# Patient Record
Sex: Male | Born: 1955 | Race: White | Hispanic: No | Marital: Married | Smoking: Never smoker
Health system: Southern US, Community
[De-identification: ages and names within clinical notes are randomized; demographics above are authoritative.]

## PROBLEM LIST (undated history)

## (undated) DIAGNOSIS — E785 Hyperlipidemia, unspecified: Secondary | ICD-10-CM

## (undated) DIAGNOSIS — E119 Type 2 diabetes mellitus without complications: Secondary | ICD-10-CM

## (undated) DIAGNOSIS — I2510 Atherosclerotic heart disease of native coronary artery without angina pectoris: Secondary | ICD-10-CM

## (undated) DIAGNOSIS — I10 Essential (primary) hypertension: Secondary | ICD-10-CM

## (undated) DIAGNOSIS — I350 Nonrheumatic aortic (valve) stenosis: Secondary | ICD-10-CM

## (undated) HISTORY — DX: Nonrheumatic aortic (valve) stenosis: I35.0

## (undated) HISTORY — DX: Essential (primary) hypertension: I10

## (undated) HISTORY — DX: Hyperlipidemia, unspecified: E78.5

## (undated) HISTORY — DX: Atherosclerotic heart disease of native coronary artery without angina pectoris: I25.10

## (undated) HISTORY — PX: EYE SURGERY: SHX253

---

## 2001-08-26 ENCOUNTER — Encounter: Payer: Self-pay | Admitting: Ophthalmology

## 2001-08-26 ENCOUNTER — Ambulatory Visit (HOSPITAL_COMMUNITY): Admission: RE | Admit: 2001-08-26 | Discharge: 2001-08-27 | Payer: Self-pay | Admitting: Ophthalmology

## 2009-09-02 ENCOUNTER — Emergency Department (HOSPITAL_COMMUNITY): Admission: EM | Admit: 2009-09-02 | Discharge: 2009-09-02 | Payer: Self-pay | Admitting: Emergency Medicine

## 2010-09-24 LAB — CBC
HCT: 47.5 % (ref 39.0–52.0)
Hemoglobin: 16.8 g/dL (ref 13.0–17.0)
MCHC: 35.2 g/dL (ref 30.0–36.0)
MCV: 96.9 fL (ref 78.0–100.0)
Platelets: 208 10*3/uL (ref 150–400)
RBC: 4.91 MIL/uL (ref 4.22–5.81)
RDW: 13.3 % (ref 11.5–15.5)
WBC: 6.4 10*3/uL (ref 4.0–10.5)

## 2010-09-24 LAB — POCT CARDIAC MARKERS: Myoglobin, poc: 69.8 ng/mL (ref 12–200)

## 2010-09-24 LAB — POCT I-STAT, CHEM 8
BUN: 13 mg/dL (ref 6–23)
Calcium, Ion: 1.11 mmol/L — ABNORMAL LOW (ref 1.12–1.32)
Chloride: 103 mEq/L (ref 96–112)
Creatinine, Ser: 0.9 mg/dL (ref 0.4–1.5)
Glucose, Bld: 139 mg/dL — ABNORMAL HIGH (ref 70–99)
HCT: 48 % (ref 39.0–52.0)
Hemoglobin: 16.3 g/dL (ref 13.0–17.0)
Potassium: 4.1 mEq/L (ref 3.5–5.1)
Sodium: 138 mEq/L (ref 135–145)
TCO2: 29 mmol/L (ref 0–100)

## 2010-09-24 LAB — DIFFERENTIAL
Basophils Absolute: 0 10*3/uL (ref 0.0–0.1)
Basophils Relative: 0 % (ref 0–1)
Eosinophils Absolute: 0 10*3/uL (ref 0.0–0.7)
Eosinophils Relative: 1 % (ref 0–5)
Lymphocytes Relative: 14 % (ref 12–46)
Lymphs Abs: 0.9 10*3/uL (ref 0.7–4.0)
Monocytes Absolute: 0.6 10*3/uL (ref 0.1–1.0)
Monocytes Relative: 9 % (ref 3–12)
Neutro Abs: 4.9 10*3/uL (ref 1.7–7.7)
Neutrophils Relative %: 76 % (ref 43–77)

## 2010-09-24 LAB — PROTIME-INR
INR: 1.1 (ref 0.00–1.49)
Prothrombin Time: 14.1 seconds (ref 11.6–15.2)

## 2010-11-17 NOTE — Op Note (Signed)
[place]. [facility]  Patient:    Samuel Wallace, Samuel Wallace Visit Number: [id number] MRN: [id number]          Service Type: DSU Location: [id number] Attending Physician:  Bertrum Sol Dictated by:   Beulah Gandy. Ashley Royalty, M.D. Proc. Date: 08/26/01 Admit Date:  08/26/2001                             Operative Report  DATE OF BIRTH:  [birth year].  PREOPERATIVE DIAGNOSIS:  Retinal break and vitreous hemorrhage in the left eye.  POSTOPERATIVE DIAGNOSIS:  Retinal break and vitreous hemorrhage in the left eye.  OPERATION PERFORMED:  Pars plana vitrectomy with retinal photocoagulation for retinal break in the left eye.  SURGEON:  Beulah Gandy. Ashley Royalty, M.D.  ASSISTANT:  Ulysees Barns, LPN  ANESTHESIA:  General.  DESCRIPTION OF PROCEDURE:  Usual prep and drape.  Peritomies at 10, 2 and 4 oclock, a 4 mm angled infusion port anchored into place at 4 oclock.  A lighted pick and the cutter were placed at 10 and 2 oclock respectively.  The pars plana vitrectomy was begun just the crystalline lens.  Blood and vitreous were mixed.  These were carefully removed with low suctioning and rapid cutting down to the macular surface.  The vitrectomy was carried out into the far periphery with a 30 degree prismatic lens.  Scleral depression was used to gain access to the far peripheral vitreous space.  The vitreous base was trimmed with the vitrectomy cutter.  Low suction was used the entire time. Once all the blood was removed from the eye, the [employer] [facility] brush was used to vacuum blood from the posterior segment of the eye on the retinal surface.  Once this was accomplished, the instruments were removed from the eye and 9-0 nylon was used to close the sclerotomy sites.  The indirect ophthalmoscope laser was moved into place and 930 burns were placed in two rows around the retinal periphery.  A break was seen in the upper temporal quadrant.  This was carefully  surrounded with laser.  The size was 1000 microns each, 0.1 second each and the power was between 400 and 450 mw.  Once this was accomplished the conjunctiva was closed with wet field cautery. Polymyxin and gentamicin were irrigated into [place] space and atropine solution was applied.  Decadron 10 mg was injected into the lower subconjunctival space.  The closing tension was less than 10 with a Barraquer tonometer.  Polysporin, a patch and shield were placed.  The patient was awakened and taken to recover in satisfactory condition.  COMPLICATIONS:  None.  DURATION:  One hour. Dictated by:   Beulah Gandy. Ashley Royalty, M.D. Attending Physician:  Bertrum Sol DD:  08/26/01 TD:  08/26/01 Job: [id number]

## 2012-09-27 ENCOUNTER — Inpatient Hospital Stay (HOSPITAL_COMMUNITY)
Admission: EM | Admit: 2012-09-27 | Discharge: 2012-09-30 | DRG: 247 | Disposition: A | Discharge status: Home / Self Care | Payer: 59 | Attending: Internal Medicine | Admitting: Internal Medicine

## 2012-09-27 ENCOUNTER — Encounter (HOSPITAL_COMMUNITY): Payer: Self-pay | Admitting: *Deleted

## 2012-09-27 ENCOUNTER — Emergency Department (HOSPITAL_COMMUNITY): Payer: 59

## 2012-09-27 DIAGNOSIS — E119 Type 2 diabetes mellitus without complications: Secondary | ICD-10-CM | POA: Diagnosis present

## 2012-09-27 DIAGNOSIS — Z955 Presence of coronary angioplasty implant and graft: Secondary | ICD-10-CM

## 2012-09-27 DIAGNOSIS — I214 Non-ST elevation (NSTEMI) myocardial infarction: Secondary | ICD-10-CM

## 2012-09-27 DIAGNOSIS — I2510 Atherosclerotic heart disease of native coronary artery without angina pectoris: Secondary | ICD-10-CM | POA: Diagnosis present

## 2012-09-27 DIAGNOSIS — I2584 Coronary atherosclerosis due to calcified coronary lesion: Secondary | ICD-10-CM | POA: Diagnosis present

## 2012-09-27 DIAGNOSIS — I10 Essential (primary) hypertension: Secondary | ICD-10-CM | POA: Diagnosis present

## 2012-09-27 DIAGNOSIS — R079 Chest pain, unspecified: Secondary | ICD-10-CM | POA: Diagnosis present

## 2012-09-27 HISTORY — DX: Type 2 diabetes mellitus without complications: E11.9

## 2012-09-27 LAB — CBC
Hemoglobin: 15.8 g/dL (ref 13.0–17.0)
MCH: 32.4 pg (ref 26.0–34.0)
MCHC: 36.1 g/dL — ABNORMAL HIGH (ref 30.0–36.0)
MCV: 90.6 fL (ref 78.0–100.0)
Platelets: 205 10*3/uL (ref 150–400)
Platelets: 234 10*3/uL (ref 150–400)
RBC: 4.88 MIL/uL (ref 4.22–5.81)
RDW: 13 % (ref 11.5–15.5)
WBC: 11.8 10*3/uL — ABNORMAL HIGH (ref 4.0–10.5)
WBC: 8.5 10*3/uL (ref 4.0–10.5)

## 2012-09-27 LAB — BASIC METABOLIC PANEL
CO2: 25 mEq/L (ref 19–32)
Calcium: 9.7 mg/dL (ref 8.4–10.5)
Chloride: 98 mEq/L (ref 96–112)
Creatinine, Ser: 0.99 mg/dL (ref 0.50–1.35)
GFR calc Af Amer: >90 mL/min (ref >90)
GFR calc non Af Amer: >90 mL/min (ref >90)
Glucose, Bld: 236 mg/dL — ABNORMAL HIGH (ref 70–99)
Potassium: 4 mEq/L (ref 3.5–5.1)
Sodium: 138 mEq/L (ref 135–145)

## 2012-09-27 LAB — POCT I-STAT TROPONIN I: Troponin i, poc: 0.16 ng/mL (ref 0.00–0.08)

## 2012-09-27 LAB — PROTIME-INR
INR: 0.96 (ref 0.00–1.49)
INR: 1.01 (ref 0.00–1.49)

## 2012-09-27 LAB — TROPONIN I: Troponin I: 3.89 ng/mL (ref <0.30)

## 2012-09-27 LAB — GLUCOSE, CAPILLARY: Glucose-Capillary: 111 mg/dL — ABNORMAL HIGH (ref 70–99)

## 2012-09-27 MED ORDER — MORPHINE SULFATE 2 MG/ML IJ SOLN: 2.0000 mg | INTRAMUSCULAR | Status: DC | PRN

## 2012-09-27 MED ORDER — OXYCODONE HCL 5 MG PO TABS: 5.0000 mg | ORAL_TABLET | ORAL | Status: DC | PRN

## 2012-09-27 MED ORDER — ONDANSETRON HCL 4 MG/2ML IJ SOLN: 4.0000 mg | Freq: Four times a day (QID) | INTRAMUSCULAR | Status: DC | PRN

## 2012-09-27 MED ORDER — DIAZEPAM 2 MG PO TABS
2.0000 mg | ORAL_TABLET | ORAL | Status: AC
  Administered 2012-09-29: 2 mg via ORAL

## 2012-09-27 MED ORDER — INSULIN ASPART 100 UNIT/ML ~~LOC~~ SOLN
0.0000 [IU] | Freq: Three times a day (TID) | SUBCUTANEOUS | Status: DC
  Administered 2012-09-28: 1 [IU] via SUBCUTANEOUS
  Administered 2012-09-28: 2 [IU] via SUBCUTANEOUS

## 2012-09-27 MED ORDER — ACETAMINOPHEN 325 MG PO TABS: 650.0000 mg | ORAL_TABLET | Freq: Four times a day (QID) | ORAL | Status: DC | PRN

## 2012-09-27 MED ORDER — SODIUM CHLORIDE 0.9 % IV SOLN: 250.0000 mL | INTRAVENOUS | Status: DC | PRN

## 2012-09-27 MED ORDER — SODIUM CHLORIDE 0.9 % IJ SOLN: 3.0000 mL | INTRAMUSCULAR | Status: DC | PRN

## 2012-09-27 MED ORDER — ASPIRIN 81 MG PO CHEW
324.0000 mg | CHEWABLE_TABLET | ORAL | Status: AC
  Administered 2012-09-29: 324 mg via ORAL
  Filled 2012-09-27: qty 4

## 2012-09-27 MED ORDER — SODIUM CHLORIDE 0.9 % IV SOLN
INTRAVENOUS | Status: DC
  Administered 2012-09-27: 10 mL via INTRAVENOUS

## 2012-09-27 MED ORDER — ASPIRIN EC 325 MG PO TBEC: 325.0000 mg | DELAYED_RELEASE_TABLET | Freq: Every day | ORAL | Status: DC

## 2012-09-27 MED ORDER — ASPIRIN EC 325 MG PO TBEC
325.0000 mg | DELAYED_RELEASE_TABLET | Freq: Every day | ORAL | Status: DC
  Administered 2012-09-28: 325 mg via ORAL
  Filled 2012-09-27 (×2): qty 1

## 2012-09-27 MED ORDER — SODIUM CHLORIDE 0.9 % IV SOLN: INTRAVENOUS | Status: DC

## 2012-09-27 MED ORDER — SODIUM CHLORIDE 0.9 % IJ SOLN
3.0000 mL | Freq: Two times a day (BID) | INTRAMUSCULAR | Status: DC
  Administered 2012-09-28: 3 mL via INTRAVENOUS

## 2012-09-27 MED ORDER — LISINOPRIL 10 MG PO TABS: 5.0000 mg | ORAL_TABLET | Freq: Every day | ORAL | Status: DC

## 2012-09-27 MED ORDER — SODIUM CHLORIDE 0.9 % IJ SOLN
3.0000 mL | Freq: Two times a day (BID) | INTRAMUSCULAR | Status: DC
  Administered 2012-09-27 – 2012-09-28 (×2): 3 mL via INTRAVENOUS

## 2012-09-27 MED ORDER — METOPROLOL TARTRATE 25 MG PO TABS
25.0000 mg | ORAL_TABLET | Freq: Two times a day (BID) | ORAL | Status: DC
  Administered 2012-09-27 – 2012-09-30 (×5): 25 mg via ORAL
  Filled 2012-09-27 (×8): qty 1

## 2012-09-27 MED ORDER — HEPARIN (PORCINE) IN NACL 100-0.45 UNIT/ML-% IJ SOLN
1700.0000 [IU]/h | INTRAMUSCULAR | Status: DC
  Administered 2012-09-27 (×2): 1200 [IU]/h via INTRAVENOUS
  Administered 2012-09-28 (×2): 1300 [IU]/h via INTRAVENOUS
  Administered 2012-09-29: 1700 [IU]/h via INTRAVENOUS
  Filled 2012-09-27 (×4): qty 250

## 2012-09-27 MED ORDER — NITROGLYCERIN IN D5W 200-5 MCG/ML-% IV SOLN
5.0000 ug/min | INTRAVENOUS | Status: DC
  Administered 2012-09-27: 5 ug/min via INTRAVENOUS
  Filled 2012-09-27: qty 250

## 2012-09-27 MED ORDER — HEPARIN BOLUS VIA INFUSION
4000.0000 [IU] | Freq: Once | INTRAVENOUS | Status: AC
  Administered 2012-09-27: 4000 [IU] via INTRAVENOUS

## 2012-09-27 MED ORDER — ACETAMINOPHEN 650 MG RE SUPP: 650.0000 mg | Freq: Four times a day (QID) | RECTAL | Status: DC | PRN

## 2012-09-27 MED ORDER — ONDANSETRON HCL 4 MG PO TABS: 4.0000 mg | ORAL_TABLET | Freq: Four times a day (QID) | ORAL | Status: DC | PRN

## 2012-09-27 NOTE — ED Notes (Signed)
Dr.Allen made aware of Istat Trop. Level. ED-Lab

## 2012-09-27 NOTE — Consult Note (Addendum)
Admit date: 09/27/2012 Referring Physician  Dr. Brien Few Primary Physician  Dr. Duanne Guess Primary Cardiologist  NONE Reason for Consultation  Chest pain  HPI: This is a 57 year old male, with known history of DM, Retinal detachment right eye in 1999, repaired at [facility], Retinal break and vitreous hemorrhage in the left eye, s/p vitrectomy/photocoagulation 08/2001, otherwise, no other significant medical problems, presenting with chest pain. Patient is a good historian, ably assisted by his spouse who was in the ED, and according to him, at about 11:00 AM today, while driving, he developed some retrosternal discomfort, which was initially mild, and felt like pressure like "indigestion".   He felt that he needed some food, so stopped and had a meal, with some amelioration of his chest discomfort, followed by worsening.He drove home, with the discomfort increasing steadily, and by the time he arrived home at 12:30 PM, pain was radiating into both jaws, he was sweaty and clammy. His spouse gave him 4 ASA, and brought him to the ED. By 2:00 PM, he was completely pain-free. Initial POC markers, showed troponin of 0.16. Cardiology is now asked to evaluate.  Currently he is pain free. He denies any chest pain.      PMH:   Past Medical History  Diagnosis Date  . Diabetes mellitus without complication      PSH:  vitrectomy/photocoagulation 08/2001  Allergies:  Review of patient's allergies indicates no known allergies. Prior to Admit Meds:   (Not in a hospital admission) Fam HX:   History reviewed. No pertinent family history. Social HX:    History   Social History  . Marital Status: Married    Spouse Name: N/A    Number of Children: N/A  . Years of Education: N/A   Occupational History  . Not on file.   Social History Main Topics  . Smoking status: Never smoked  . Smokeless tobacco: No  . Alcohol Use: No  . Drug Use: No  . Sexually Active: Not on file   Other Topics Concern  . Not on file    Social History Narrative  . No narrative on file     ROS:  All 11 ROS were addressed and are negative except what is stated in the HPI  Physical Exam: Blood pressure 148/92, pulse 89, temperature 98.3 F (36.8 C), temperature source Oral, resp. rate 16, SpO2 97.00%.    General: Well developed, well nourished, in no acute distress Head: Eyes PERRLA, No xanthomas.   Normal cephalic and atramatic  Lungs:   Clear bilaterally to auscultation and percussion. Heart:   HRRR S1 S2 Pulses are 2+ & equal.            No carotid bruit. No JVD.  No abdominal bruits. No femoral bruits. Abdomen: Bowel sounds are positive, abdomen soft and non-tender without masses Extremities:   No clubbing, cyanosis or edema.  DP +1 Neuro: Alert and oriented X 3. Psych:  Good affect, responds appropriately    Labs:   Lab Results  Component Value Date   WBC 11.8* 09/27/2012   HGB 15.8 09/27/2012   HCT 44.2 09/27/2012   MCV 90.6 09/27/2012   PLT 205 09/27/2012    Recent Labs Lab 09/27/12 1445  NA 136  K 4.3  CL 98  CO2 25  BUN 18  CREATININE 0.99  CALCIUM 10.2  GLUCOSE 236*   No results found for this basename: PTT   Lab Results  Component Value Date   INR 1.10 09/02/2009  Radiology:  Dg Chest 2 View  09/27/2012  *RADIOLOGY REPORT*  Clinical Data: Chest pressure, jaw pain  CHEST - 2 VIEW  Comparison: 09/02/2009  Findings: Normal heart size and vascularity.  Mild hyperinflation. No focal pneumonia, collapse, consolidation, effusion or pneumothorax.  Chronic apical scarring.  Stable exam.  IMPRESSION: Stable chest exam.  No superimposed acute process   Original Report Authenticated By: Judie Petit. Shick, M.D.     EKG:  NSR with septal infarct and nonspecific T wave changes  ASSESSMENT:  1.  Acute coronary syndrome with intial POC troponin elevated c/w NSTEMI - currently pain free 2.  DM 3.  HTN  PLAN:   1.  Continue to cycle cardiac enzymes 2.  Continue ACE I/ASA/IV Heparin gtt 3.  Hold  Metformin 4.  Start Lopressor 25mg  BID 5.  NPO after midnight 3/30 for cath in am 3/31   Quintella Reichert, MD  09/27/2012  6:10 PM

## 2012-09-27 NOTE — Progress Notes (Signed)
ANTICOAGULATION CONSULT NOTE - Initial Consult  Pharmacy Consult for Heparin Indication: chest pain/ACS  No Known Allergies  Patient Measurements:   Heparin Dosing Weight:    Vital Signs: Temp: 98.3 F (36.8 C) (03/29 1440) Temp src: Oral (03/29 1440) BP: 146/95 mmHg (03/29 1800) Pulse Rate: 92 (03/29 1800)  Labs:  Recent Labs  09/27/12 1445  HGB 15.8  HCT 44.2  PLT 205  CREATININE 0.99    CrCl is unknown because there is no height on file for the current visit.   Medical History: Past Medical History  Diagnosis Date  . Diabetes mellitus without complication     Medications: ASA 81mg , Glipizide, metformin  Assessment: Chest pressure + jaw pain PMH only significant for DM. Initial troponin 0.16. BMET and CBC WNL.  Goal of Therapy:  Heparin level 0.3-0.7 units/ml Monitor platelets by anticoagulation protocol: Yes   Plan:  Cath 3/31 Heparin 4000 unit IV bolus, then infusiion 1200 units/hr Check heparin level in 6-8 hrs and daily.   Enisa Runyan S. Merilynn Finland, PharmD, BCPS Clinical Staff Pharmacist Pager [phone]  Misty Stanley Stillinger 09/27/2012,6:29 PM

## 2012-09-27 NOTE — Progress Notes (Signed)
Entered in error

## 2012-09-27 NOTE — Progress Notes (Signed)
Comment:   Has bump in Troponin to 3.89. Have discussed with Dr Mayford Knife. Cath is already scheduled for 09/29/12, and this will remain same. But patient to be transferred to SDU, and commenced on NTG ivi, as well as continued beta-blocker. Will DC Lisinopril.   C. Cailah Reach. MD, FACP.

## 2012-09-27 NOTE — ED Notes (Signed)
Pt reports onset at 11am of mid chest pressure that felt like gas bubble, but also had discomfort in his jaw. Pain has gradually improved pta. No acute distress noted at triage.

## 2012-09-27 NOTE — ED Provider Notes (Addendum)
History     CSN: [id number]  Arrival date & time 09/27/12  1413   First MD Initiated Contact with Patient 09/27/12 1511      Chief Complaint  Patient presents with  . Chest Pain    (Consider location/radiation/quality/duration/timing/severity/associated sxs/prior treatment) HPI Comments: 57 y/o comes in with cc of chest pain. Pt has hx of NIDDM. States that around noon, he was driving and started having pressure like ("like a bubble is sitting on the chest") pain that was unprovoked. The pain was constant, non radiating, and had some associated clamminess.  Pt denies nausea, emesis, fevers, chills, shortness of breath, headaches, abdominal pain, dizziness. Pt has no hx of dvt, pe and no risk factors for the same. No hx of chest pain or any cardiac workup.  Patient is a 57 y.o. male presenting with chest pain. The history is provided by the patient.  Chest Pain Associated symptoms: diaphoresis   Associated symptoms: no cough, no dizziness, no fever, no headache and no shortness of breath     Past Medical History  Diagnosis Date  . Diabetes mellitus without complication     History reviewed. No pertinent past surgical history.  History reviewed. No pertinent family history.  History  Substance Use Topics  . Smoking status: Not on file  . Smokeless tobacco: Not on file  . Alcohol Use: No      Review of Systems  Constitutional: Positive for diaphoresis. Negative for fever, chills and activity change.  HENT: Negative for neck pain.   Eyes: Negative for visual disturbance.  Respiratory: Positive for chest tightness. Negative for cough and shortness of breath.   Cardiovascular: Positive for chest pain.  Gastrointestinal: Negative for abdominal distention.  Genitourinary: Negative for dysuria, enuresis and difficulty urinating.  Musculoskeletal: Negative for arthralgias.  Neurological: Negative for dizziness, light-headedness and headaches.  Psychiatric/Behavioral: Negative  for confusion.    Allergies  Review of patient's allergies indicates no known allergies.  Home Medications  No current outpatient prescriptions on file.  BP 161/89  Pulse 92  Temp(Src) 98.3 F (36.8 C) (Oral)  Resp 18  SpO2 99%  Physical Exam  Nursing note and vitals reviewed. Constitutional: He is oriented to person, place, and time. He appears well-developed.  HENT:  Head: Normocephalic and atraumatic.  Eyes: Conjunctivae and EOM are normal. Pupils are equal, round, and reactive to light.  Neck: Normal range of motion. Neck supple.  Cardiovascular: Normal rate and regular rhythm.   Pulmonary/Chest: Effort normal and breath sounds normal.  Abdominal: Soft. Bowel sounds are normal. He exhibits no distension. There is no tenderness. There is no rebound and no guarding.  Neurological: He is alert and oriented to person, place, and time.  Skin: Skin is warm.    ED Course  Procedures (including critical care time)  Labs Reviewed  CBC - Abnormal; Notable for the following:    WBC 11.8 (*)    All other components within normal limits  BASIC METABOLIC PANEL - Abnormal; Notable for the following:    Glucose, Bld 236 (*)    GFR calc non Af Amer 90 (*)    All other components within normal limits  POCT I-STAT TROPONIN I - Abnormal; Notable for the following:    Troponin i, poc 0.16 (*)    All other components within normal limits   Dg Chest 2 View  09/27/2012  *RADIOLOGY REPORT*  Clinical Data: Chest pressure, jaw pain  CHEST - 2 VIEW  Comparison: 09/02/2009  Findings: Normal  heart size and vascularity.  Mild hyperinflation. No focal pneumonia, collapse, consolidation, effusion or pneumothorax.  Chronic apical scarring.  Stable exam.  IMPRESSION: Stable chest exam.  No superimposed acute process   Original Report Authenticated By: Judie Petit. Miles Costain, M.D.      No diagnosis found.    MDM   Date: 09/27/2012  Rate: 99  Rhythm: normal sinus rhythm  QRS Axis: normal  Intervals:  normal  ST/T Wave abnormalities: nonspecific ST/T changes  Conduction Disutrbances:none  Narrative Interpretation:   Old EKG Reviewed: none available T wave inversion in the septal leads  Differential diagnosis includes: ACS syndrome CHF exacerbation Valvular disorder Myocarditis Pericarditis Pericardial effusion Pneumonia Pleural effusion Pulmonary edema PE  Pt comes in with cc of chest pain. He is diabetic. The pain had some typical features to it. He took 4 ASA prior to arrival, and is currently chest pain free. EKG does have some evidence of possible old insults. Will admit for ACS rule out and possible provocative testing.   3:36 PM Initial POC troponin is .16. I will not initiate heparin. If the repeat troponin is high, go the ACS/NSTEMI route.   Derwood Kaplan, MD 09/27/12 1537  7:03 PM Repeat trop > 2. Admitting team notified, who inturn is updating Dr. Mayford Knife, Cardiology. Pt is still chest pain free. Repeat EKG is WNL Filed Vitals:   09/27/12 1900  BP: 133/96  Pulse: 87  Temp:   Resp: 15     Date: 09/27/2012  Rate: 72  Rhythm: normal sinus rhythm  QRS Axis: normal  Intervals: normal  ST/T Wave abnormalities: VERY SUBTLE V2, V3 ST ELEVATION, ABOUT 1 MM, NO RECIPROCAL CHANGES  Conduction Disutrbances: none  Narrative Interpretation: unremarkable  Derwood Kaplan, MD 09/27/12 1908  CRITICAL CARE Performed by: Derwood Kaplan   Total critical care time: 30 MINUTES  Critical care time was exclusive of separately billable procedures and treating other patients.  Critical care was necessary to treat or prevent imminent or life-threatening deterioration.  Critical care was time spent personally by me on the following activities: development of treatment plan with patient and/or surrogate as well as nursing, discussions with consultants, evaluation of patient's response to treatment, examination of patient, obtaining history from patient or  surrogate, ordering and performing treatments and interventions, ordering and review of laboratory studies, ordering and review of radiographic studies, pulse oximetry and re-evaluation of patient's condition.   Derwood Kaplan, MD 09/27/12 Windell Moment

## 2012-09-27 NOTE — H&P (Signed)
PCP:   Maryelizabeth Rowan, MD   Chief Complaint:  Chest pain.  HPI: This is a 57 year old male, with known history of DM, Retinal detachment right eye in 1999, reepaired at [facility], Retinal break and vitreous hemorrhage in the left eye, s/p vitrectomy/photocoagulation 08/2001, otherwise, no other significant medical problems, presenting with chest pain. Patient is a good historian, ably assisted by his spouse who was in the ED, and according to him, at about 11:00 AM today, while driving, he developed some retrosternal discomfort, which was initially mild, and felt like "indigestion" He felt that he needed some food, so stopped and had a meal, with some amelioration of his chest discomfort, followed by worsening.He drove home, with the discomfort increasing steadily, and by the time he arrived home at 12:30 PM, pain was radiating into both jaws, he was sweaty and clammy. His spouse gave him 4 ASA, and brought him to the ED. By 2:00 PM, he was completely pain-free. Initial POC markers, showed troponin of 0.16.   Allergies:  No Known Allergies    Past Medical History  Diagnosis Date  . Diabetes mellitus without complication     History reviewed. No pertinent past surgical history.  Prior to Admission medications   Not on File    Social History: Patient reports that he does not drink alcohol or use illicit drugs. His tobacco history is not on file.  Family History reviewed: Mother is alive and well at 58 years, father died at age 83 years, s/p recurrent CVAs.   Review of Systems:  As per HPI and chief complaint. Patent denies fatigue, diminished appetite, weight loss, fever, chills, headache, blurred vision, difficulty in speaking, dysphagia, cough, shortness of breath, orthopnea, paroxysmal nocturnal dyspnea, nausea, diaphoresis, abdominal pain, vomiting, diarrhea, belching, heartburn, hematemesis, melena, dysuria, nocturia, urinary frequency, hematochezia, lower extremity swelling, pain, or  redness. The rest of the systems review is negative.  Physical Exam:  General:  Patient does not appear to be in obvious acute distress. Alert, communicative, fully oriented, talking in complete sentences, not short of breath at rest.  HEENT:  No clinical pallor, no jaundice, no conjunctival injection or discharge. Hydration is fair.  NECK:  Supple, JVP not seen, no carotid bruits, no palpable lymphadenopathy, no palpable goiter. CHEST:  Clinically clear to auscultation, no wheezes, no crackles. HEART:  Sounds 1 and 2 heard, normal, regular, no murmurs. ABDOMEN:  Full, soft, non-tender, no palpable organomegaly, no palpable masses, normal bowel sounds. GENITALIA:  Not examined. LOWER EXTREMITIES:  No pitting edema, palpable peripheral pulses. MUSCULOSKELETAL SYSTEM:  Generalized osteoarthritic changes, otherwise, normal. CENTRAL NERVOUS SYSTEM:  No focal neurologic deficit on gross examination.  Labs on Admission:  Results for orders placed during the hospital encounter of 09/27/12 (from the past 48 hour(s))  CBC     Status: Abnormal   Collection Time    09/27/12  2:45 PM      Result Value Range   WBC 11.8 (*) 4.0 - 10.5 K/uL   RBC 4.88  4.22 - 5.81 MIL/uL   Hemoglobin 15.8  13.0 - 17.0 g/dL   HCT 81.1  91.4 - 78.2 %   MCV 90.6  78.0 - 100.0 fL   MCH 32.4  26.0 - 34.0 pg   MCHC 35.7  30.0 - 36.0 g/dL   RDW 95.6  21.3 - 08.6 %   Platelets 205  150 - 400 K/uL  BASIC METABOLIC PANEL     Status: Abnormal   Collection Time  09/27/12  2:45 PM      Result Value Range   Sodium 136  135 - 145 mEq/L   Potassium 4.3  3.5 - 5.1 mEq/L   Chloride 98  96 - 112 mEq/L   CO2 25  19 - 32 mEq/L   Glucose, Bld 236 (*) 70 - 99 mg/dL   BUN 18  6 - 23 mg/dL   Creatinine, Ser 7.82  0.50 - 1.35 mg/dL   Calcium 95.6  8.4 - 21.3 mg/dL   GFR calc non Af Amer 90 (*) >90 mL/min   GFR calc Af Amer >90  >90 mL/min   Comment:            The eGFR has been calculated     using the CKD EPI equation.      This calculation has not been     validated in all clinical     situations.     eGFR's persistently     <90 mL/min signify     possible Chronic Kidney Disease.  POCT I-STAT TROPONIN I     Status: Abnormal   Collection Time    09/27/12  2:52 PM      Result Value Range   Troponin i, poc 0.16 (*) 0.00 - 0.08 ng/mL   Comment NOTIFIED PHYSICIAN     Comment 3            Comment: Due to the release kinetics of cTnI,     a negative result within the first hours     of the onset of symptoms does not rule out     myocardial infarction with certainty.     If myocardial infarction is still suspected,     repeat the test at appropriate intervals.    Radiological Exams on Admission: Dg Chest 2 View  09/27/2012  *RADIOLOGY REPORT*  Clinical Data: Chest pressure, jaw pain  CHEST - 2 VIEW  Comparison: 09/02/2009  Findings: Normal heart size and vascularity.  Mild hyperinflation. No focal pneumonia, collapse, consolidation, effusion or pneumothorax.  Chronic apical scarring.  Stable exam.  IMPRESSION: Stable chest exam.  No superimposed acute process   Original Report Authenticated By: Judie Petit. Miles Costain, M.D.     Assessment/Plan Principal Problem:    1. Chest pain: Patient presented with retrosternal chest paibn, radiating to both sides of jaw, and associated with diaphoresis. CXR is devoid of acute disease, 12-lead EKG shows SR, old Q-waves in V1-V3, and flattening of T-Waves in V5-V6. He is pain-free currently, but POC troponin is elevated. He does have risk factors of age, DM, gender, and is mildly hypertensive in the ED. There is suspicion therefore, for ACS. Will admit to telemetry, commence ivi Heparin, continue cycling cardiac enzymes, and consult cardiologist.   2. DM (diabetes mellitus): This is type 2, and per patient, his HBA1C was 5.5 about 2 months ago. Random glucose is 236 at this time, so will commence SSI and recheck HBA1C.  3. HTN (hypertension): Patient has no known previous history of HTN,  but BP is sub-optimal at 161/89-148/92. Will commence low dose ACE-i.   Further management will depend on clinical course.   Comment: Patient is FULL CODE.    Time Spent on Admission: 45 mins.   Jaymee Tilson,CHRISTOPHER 09/27/2012, 5:53 PM

## 2012-09-28 DIAGNOSIS — I214 Non-ST elevation (NSTEMI) myocardial infarction: Secondary | ICD-10-CM

## 2012-09-28 LAB — COMPREHENSIVE METABOLIC PANEL
ALT: 33 U/L (ref 0–53)
AST: 74 U/L — ABNORMAL HIGH (ref 0–37)
Albumin: 4.2 g/dL (ref 3.5–5.2)
Alkaline Phosphatase: 60 U/L (ref 39–117)
BUN: 16 mg/dL (ref 6–23)
Chloride: 102 mEq/L (ref 96–112)
Potassium: 4.2 mEq/L (ref 3.5–5.1)
Sodium: 140 mEq/L (ref 135–145)
Total Bilirubin: 0.6 mg/dL (ref 0.3–1.2)
Total Protein: 7.2 g/dL (ref 6.0–8.3)

## 2012-09-28 LAB — TROPONIN I
Troponin I: 8.51 ng/mL (ref <0.30)
Troponin I: 6.37 ng/mL (ref <0.30)

## 2012-09-28 LAB — GLUCOSE, CAPILLARY: Glucose-Capillary: 140 mg/dL — ABNORMAL HIGH (ref 70–99)

## 2012-09-28 LAB — HEPARIN LEVEL (UNFRACTIONATED)
Heparin Unfractionated: 0.24 IU/mL — ABNORMAL LOW (ref 0.30–0.70)
Heparin Unfractionated: 0.17 IU/mL — ABNORMAL LOW (ref 0.30–0.70)

## 2012-09-28 LAB — CBC
HCT: 45.4 % (ref 39.0–52.0)
MCHC: 35.2 g/dL (ref 30.0–36.0)
RDW: 13.3 % (ref 11.5–15.5)
WBC: 9.3 10*3/uL (ref 4.0–10.5)

## 2012-09-28 LAB — MRSA PCR SCREENING: MRSA by PCR: NEGATIVE

## 2012-09-28 LAB — HEMOGLOBIN A1C
Hgb A1c MFr Bld: 5.9 % — ABNORMAL HIGH (ref <5.7)
Mean Plasma Glucose: 123 mg/dL — ABNORMAL HIGH (ref <117)

## 2012-09-28 MED ORDER — HEPARIN BOLUS VIA INFUSION
1000.0000 [IU] | Freq: Once | INTRAVENOUS | Status: AC
  Administered 2012-09-28: 1000 [IU] via INTRAVENOUS
  Filled 2012-09-28: qty 1000

## 2012-09-28 MED ORDER — HEPARIN BOLUS VIA INFUSION
2000.0000 [IU] | Freq: Once | INTRAVENOUS | Status: AC
  Administered 2012-09-28: 2000 [IU] via INTRAVENOUS
  Filled 2012-09-28: qty 2000

## 2012-09-28 MED ORDER — SODIUM CHLORIDE 0.9 % IJ SOLN: 3.0000 mL | INTRAMUSCULAR | Status: DC | PRN

## 2012-09-28 MED ORDER — SODIUM CHLORIDE 0.9 % IV SOLN: INTRAVENOUS | Status: DC

## 2012-09-28 MED ORDER — LATANOPROST 0.005 % OP SOLN
1.0000 [drp] | Freq: Every day | OPHTHALMIC | Status: DC
  Administered 2012-09-28 – 2012-09-29 (×2): 1 [drp] via OPHTHALMIC
  Filled 2012-09-28 (×2): qty 2.5

## 2012-09-28 NOTE — Progress Notes (Signed)
[facility] PROGRESS NOTE  VANDER KUEKER ZOX:[id number] DOB: 11-Oct-1955 DOA: 09/27/2012 PCP: Maryelizabeth Rowan, MD  Assessment/Plan: Principal Problem:   Chest pain Active Problems:   DM (diabetes mellitus)   HTN (hypertension)    1. Chest painNSTEMI: Patient presented with retrosternal chest pain, radiating to both sides of jaw, and associated with diaphoresis. CXR was devoid of acute disease, 12-lead EKG showed SR, old Q-waves in V1-V3, and flattening of T-Waves in V5-V6. POC troponin was elevated at 0.16, and formal troponin markedly elevated at 3.89. Dr Armanda Magic provided cardiology consultation, and patient was commenced on ivi Heparin, ivi NTG, ASA and beta-blocker. He has remained asymptomatic overnight. Cardiac catheterization is scheduled for 09/29/12. Continue current management.  2. DM (diabetes mellitus): This is type 2, and per patient, his HBA1C was 5.5 about 2 months ago. Random glucose was 236 at presentation. Metformin and Glipizide are on hold. Managing with diet/SSI, with excellent control. HBA1C is 5.9..  3. HTN (hypertension): Patient has no known previous history of HTN, but BP was sub-optimal at 161/89-148/92 on admission. Now controlled on beta-blocker/NTG. Perhaps, ACE-i will be considered at discharge.    Code Status: Full Code.  Family Communication:  Disposition Plan: To be determined.    Brief narrative: This is a 57 year old male, with known history of DM, Retinal detachment right eye in 1999, reepaired at [facility], Retinal break and vitreous hemorrhage in the left eye, s/p vitrectomy/photocoagulation 08/2001, otherwise, no other significant medical problems, presenting with chest pain. According to him, at about 11:00 AM on 09/27/12, while driving, he developed retrosternal discomfort, which was initially mild, and felt like "indigestion" He felt that he needed some food, so stopped and had a meal, with some amelioration of his chest discomfort, followed by  worsening.He drove home, with the discomfort increasing steadily, and by the time he arrived home at 12:30 PM, pain was radiating into both jaws, he was sweaty and clammy. His spouse gave him 4 ASA, and brought him to the ED. By 2:00 PM, he was completely pain-free. Initial POC markers, showed troponin of 0.16. Admitted for further management.    Consultants:  Dr Armanda Magic, cardiologist.   Procedures:  CXR.   Antibiotics:  N/A.   HPI/Subjective: No recurrence of chest pain, no SOB.   Objective: Vital signs in last 24 hours: Temp:  [97.9 F (36.6 C)-98.9 F (37.2 C)] 97.9 F (36.6 C) (03/30 0742) Pulse Rate:  [69-98] 69 (03/30 0313) Resp:  [11-18] 14 (03/30 0313) BP: (116-161)/(61-106) 116/67 mmHg (03/30 0313) SpO2:  [94 %-99 %] 94 % (03/30 0313) Weight:  [78.8 kg (173 lb 11.6 oz)-80.74 kg (178 lb)] 78.8 kg (173 lb 11.6 oz) (03/29 2029) Weight change:  Last BM Date: 09/26/12  Intake/Output from previous day: 03/29 0701 - 03/30 0700 In: 343.4 [P.O.:120; I.V.:223.4] Out: 600 [Urine:600]     Physical Exam: General: Comfortable, alert, communicative, fully oriented, talking in complete sentences, not short of breath at rest.  HEENT: No clinical pallor, no jaundice, no conjunctival injection or discharge. Hydration is fair.  NECK: Supple, JVP not seen, no carotid bruits, no palpable lymphadenopathy, no palpable goiter.  CHEST: Clinically clear to auscultation, no wheezes, no crackles.  HEART: Sounds 1 and 2 heard, normal, regular, no murmurs.  ABDOMEN: Full, soft, non-tender, no palpable organomegaly, no palpable masses, normal bowel sounds.  GENITALIA: Not examined.  LOWER EXTREMITIES: No pitting edema, palpable peripheral pulses.  MUSCULOSKELETAL SYSTEM: Generalized osteoarthritic changes, otherwise, normal.  CENTRAL NERVOUS SYSTEM: No  focal neurologic deficit on gross examination.  Lab Results:  Recent Labs  09/27/12 2047 09/28/12 0610  WBC 8.5 9.3  HGB 15.5  16.0  HCT 42.9 45.4  PLT 234 239    Recent Labs  09/27/12 2047 09/28/12 0610  NA 138 140  K 4.0 4.2  CL 101 102  CO2 25 27  GLUCOSE 115* 119*  BUN 17 16  CREATININE 0.91 0.94  CALCIUM 9.7 9.5   Recent Results (from the past 240 hour(s))  MRSA PCR SCREENING     Status: None   Collection Time    09/27/12  8:25 PM      Result Value Range Status   MRSA by PCR NEGATIVE  NEGATIVE Final   Comment:            The GeneXpert MRSA Assay (FDA     approved for NASAL specimens     only), is one component of a     comprehensive MRSA colonization     surveillance program. It is not     intended to diagnose MRSA     infection nor to guide or     monitor treatment for     MRSA infections.     Studies/Results: Dg Chest 2 View  09/27/2012  *RADIOLOGY REPORT*  Clinical Data: Chest pressure, jaw pain  CHEST - 2 VIEW  Comparison: 09/02/2009  Findings: Normal heart size and vascularity.  Mild hyperinflation. No focal pneumonia, collapse, consolidation, effusion or pneumothorax.  Chronic apical scarring.  Stable exam.  IMPRESSION: Stable chest exam.  No superimposed acute process   Original Report Authenticated By: Judie Petit. Miles Costain, M.D.     Medications: Scheduled Meds: . [START ON 09/29/2012] aspirin  324 mg Oral Pre-Cath  . aspirin EC  325 mg Oral Daily  . [START ON 09/30/2012] aspirin EC  325 mg Oral Daily  . [START ON 09/29/2012] diazepam  2 mg Oral On Call  . insulin aspart  0-9 Units Subcutaneous TID WC  . latanoprost  1 drop Right Eye QHS  . metoprolol tartrate  25 mg Oral BID  . sodium chloride  3 mL Intravenous Q12H  . sodium chloride  3 mL Intravenous Q12H  . sodium chloride  3 mL Intravenous Q12H   Continuous Infusions: . sodium chloride 10 mL (09/27/12 2150)  . [START ON 09/29/2012] sodium chloride    . heparin 1,300 Units/hr (09/28/12 0654)  . nitroGLYCERIN 5 mcg/min (09/27/12 2030)   PRN Meds:.sodium chloride, sodium chloride, acetaminophen, acetaminophen, morphine injection,  ondansetron (ZOFRAN) IV, ondansetron, oxyCODONE, sodium chloride, sodium chloride    LOS: 1 day   Tyrian Peart,CHRISTOPHER  [facility] Pager [phone]. If 8PM-8AM, please contact night-coverage at [email or web address], password [facility] 09/28/2012, 7:52 AM  LOS: 1 day

## 2012-09-28 NOTE — Progress Notes (Addendum)
SUBJECTIVE:  No complaints of chest pain overnight  OBJECTIVE:   Vitals:   Filed Vitals:   09/27/12 2200 09/27/12 2300 09/28/12 0313 09/28/12 0742  BP: 137/78 116/61 116/67   Pulse: 69 73 69   Temp:  98.9 F (37.2 C) 98.8 F (37.1 C) 97.9 F (36.6 C)  TempSrc:  Oral Oral Oral  Resp: 17 16 14    Height:      Weight:      SpO2: 96% 97% 94%    I&O's:   Intake/Output Summary (Last 24 hours) at 09/28/12 0825 Last data filed at 09/28/12 0600  Gross per 24 hour  Intake 343.42 ml  Output    600 ml  Net -256.58 ml   TELEMETRY: Reviewed telemetry pt in NSR:     PHYSICAL EXAM General: Well developed, well nourished, in no acute distress Head: Eyes PERRLA, No xanthomas.   Normal cephalic and atramatic  Lungs:   Clear bilaterally to auscultation and percussion. Heart:   HRRR S1 S2 Pulses are 2+ & equal. Abdomen: Bowel sounds are positive, abdomen soft and non-tender without masses Extremities:   No clubbing, cyanosis or edema.  DP +1 Neuro: Alert and oriented X 3. Psych:  Good affect, responds appropriately   LABS: Basic Metabolic Panel:  Recent Labs  96/04/54 2047 09/28/12 0610  NA 138 140  K 4.0 4.2  CL 101 102  CO2 25 27  GLUCOSE 115* 119*  BUN 17 16  CREATININE 0.91 0.94  CALCIUM 9.7 9.5   Liver Function Tests:  Recent Labs  09/28/12 0610  AST 74*  ALT 33  ALKPHOS 60  BILITOT 0.6  PROT 7.2  ALBUMIN 4.2   No results found for this basename: LIPASE, AMYLASE,  in the last 72 hours CBC:  Recent Labs  09/27/12 2047 09/28/12 0610  WBC 8.5 9.3  HGB 15.5 16.0  HCT 42.9 45.4  MCV 90.5 93.8  PLT 234 239   Cardiac Enzymes:  Recent Labs  09/27/12 1741  TROPONINI 3.89*   BNP: No components found with this basename: POCBNP,  D-Dimer: No results found for this basename: DDIMER,  in the last 72 hours Hemoglobin A1C:  Recent Labs  09/27/12 1808  HGBA1C 5.9*   Coag Panel:   Lab Results  Component Value Date   INR 1.01 09/27/2012   INR 0.96  09/27/2012   INR 1.10 09/02/2009    RADIOLOGY: Dg Chest 2 View  09/27/2012  *RADIOLOGY REPORT*  Clinical Data: Chest pressure, jaw pain  CHEST - 2 VIEW  Comparison: 09/02/2009  Findings: Normal heart size and vascularity.  Mild hyperinflation. No focal pneumonia, collapse, consolidation, effusion or pneumothorax.  Chronic apical scarring.  Stable exam.  IMPRESSION: Stable chest exam.  No superimposed acute process   Original Report Authenticated By: Judie Petit. Shick, M.D.     ASSESSMENT:  1. Acute coronary syndrome with intial POC troponin elevated c/w NSTEMI - currently pain free  2. DM  3. HTN   PLAN:  1. Continue ACE I/ASA/IV Heparin gtt/beta blocker/IV NTG 2. Hold Metformin  3. NPO after midnight 3/30 for cath in am 3/31  4.  Cardiac catheterization was discussed with the patient fully including risks on myocardial infarction, death, stroke, bleeding, arrhythmia, dye allergy, renal insufficiency or bleeding.  All patient questions and concerns were discussed and the patient understands and is willing to proceed.        Quintella Reichert, MD  09/28/2012  8:25 AM

## 2012-09-28 NOTE — Progress Notes (Signed)
ANTICOAGULATION CONSULT NOTE - Follow Up Consult  Pharmacy Consult for Heparin Indication: chest pain/ACS  No Known Allergies  Patient Measurements: Height: 5\' 11"  (180.3 cm) Weight: 173 lb 11.6 oz (78.8 kg) IBW/kg (Calculated) : 75.3 Heparin Dosing Weight: 78.8kg  Vital Signs: Temp: 98.5 F (36.9 C) (03/30 1945) Temp src: Axillary (03/30 1945) BP: 144/79 mmHg (03/30 1945) Pulse Rate: 66 (03/30 1556)  Labs:  Recent Labs  09/27/12 1445 09/27/12 1741 09/27/12 1808 09/27/12 2047 09/28/12 0610 09/28/12 0830 09/28/12 1325 09/28/12 2005  HGB 15.8  --   --  15.5 16.0  --   --   --   HCT 44.2  --   --  42.9 45.4  --   --   --   PLT 205  --   --  234 239  --   --   --   APTT  --   --  36  --   --   --   --   --   LABPROT  --   --  12.7 13.2  --   --   --   --   INR  --   --  0.96 1.01  --   --   --   --   HEPARINUNFRC  --   --   --   --  0.24*  --  0.17* 0.22*  CREATININE 0.99  --   --  0.91 0.94  --   --   --   TROPONINI  --  3.89*  --   --   --  8.51* 6.37*  --     Estimated Creatinine Clearance: 93.5 ml/min (by C-G formula based on Cr of 0.94).   Medications:  Heparin 1500 units/hr  Assessment: 56yom on heparin for CP/ACS. Heparin level (0.22) remains subtherapeutic but is trending up - will increase rate and follow-up 6 hour heparin level. - H/H and Plts wnl - No significant bleeding reported - No problems with line or infusion  Goal of Therapy:  Heparin level 0.3-0.7 units/ml Monitor platelets by anticoagulation protocol: Yes   Plan:  1. Heparin IV bolus 1000 units x 1 2. Increase heparin drip 1700 units/hr (17 ml/hr) 3. Check heparin level 6 hours after rate increase  Cleon Dew [phone] 09/28/2012,8:55 PM

## 2012-09-28 NOTE — Progress Notes (Signed)
ANTICOAGULATION CONSULT NOTE - Follow Up Consult  Pharmacy Consult for heparin Indication: chest pain/ACS  Labs:  Recent Labs  09/27/12 1445 09/27/12 1741 09/27/12 1808 09/27/12 2047 09/28/12 0610  HGB 15.8  --   --  15.5 16.0  HCT 44.2  --   --  42.9 45.4  PLT 205  --   --  234 239  APTT  --   --  36  --   --   LABPROT  --   --  12.7 13.2  --   INR  --   --  0.96 1.01  --   HEPARINUNFRC  --   --   --   --  0.24*  CREATININE 0.99  --   --  0.91  --   TROPONINI  --  3.89*  --   --   --     Assessment: 57yo male subtherapeutic on heparin with initial dosing for CP.  Goal of Therapy:  Heparin level 0.3-0.7 units/ml   Plan:  Will increase heparin gtt by 1 unit/kg/hr to 1300 units/hr and check level in 6hr.  Vernard Gambles, PharmD, BCPS  09/28/2012,6:53 AM

## 2012-09-28 NOTE — Progress Notes (Signed)
ANTICOAGULATION CONSULT NOTE - Follow Up Consult  Pharmacy Consult for heparin Indication: chest pain/ACS  Labs:  Recent Labs  09/27/12 1445 09/27/12 1741 09/27/12 1808 09/27/12 2047 09/28/12 0610 09/28/12 0830 09/28/12 1325  HGB 15.8  --   --  15.5 16.0  --   --   HCT 44.2  --   --  42.9 45.4  --   --   PLT 205  --   --  234 239  --   --   APTT  --   --  36  --   --   --   --   LABPROT  --   --  12.7 13.2  --   --   --   INR  --   --  0.96 1.01  --   --   --   HEPARINUNFRC  --   --   --   --  0.24*  --  0.17*  CREATININE 0.99  --   --  0.91 0.94  --   --   TROPONINI  --  3.89*  --   --   --  8.51*  --     Assessment: 56yo male subtherapeutic on heparin with initial dosing for CP. Current heparin level is sub-therapeutic at 0.17 on 1300 units/hr. No bleeding noted or per RN.  Goal of Therapy:  Heparin level 0.3-0.7 units/ml   Plan:  Heparin IV 2000 unit bolus now Increase heparin gtt to 1500 units/hr Check heparin level in 6hr.  Vania Rea. Darin Engels.D. Clinical Pharmacist Pager [phone] Phone [phone] 09/28/2012 2:22 PM

## 2012-09-28 NOTE — Progress Notes (Signed)
  Echocardiogram 2D Echocardiogram has been performed.  Samuel Wallace 09/28/2012, 12:12 PM

## 2012-09-29 ENCOUNTER — Encounter (HOSPITAL_COMMUNITY)
Admission: EM | Disposition: A | Discharge status: Home / Self Care | Payer: Self-pay | Source: Home / Self Care | Attending: Internal Medicine

## 2012-09-29 DIAGNOSIS — I2510 Atherosclerotic heart disease of native coronary artery without angina pectoris: Secondary | ICD-10-CM | POA: Diagnosis present

## 2012-09-29 HISTORY — PX: LEFT AND RIGHT HEART CATHETERIZATION WITH CORONARY ANGIOGRAM: SHX5449

## 2012-09-29 LAB — GLUCOSE, CAPILLARY: Glucose-Capillary: 148 mg/dL — ABNORMAL HIGH (ref 70–99)

## 2012-09-29 LAB — LIPID PANEL
Cholesterol: 194 mg/dL (ref 0–200)
HDL: 31 mg/dL — ABNORMAL LOW (ref >39)
Total CHOL/HDL Ratio: 6.3 RATIO
Triglycerides: 352 mg/dL — ABNORMAL HIGH (ref <150)
VLDL: 70 mg/dL — ABNORMAL HIGH (ref 0–40)

## 2012-09-29 LAB — BASIC METABOLIC PANEL
BUN: 13 mg/dL (ref 6–23)
CO2: 27 mEq/L (ref 19–32)
Chloride: 100 mEq/L (ref 96–112)
Glucose, Bld: 133 mg/dL — ABNORMAL HIGH (ref 70–99)
Potassium: 4.2 mEq/L (ref 3.5–5.1)
Sodium: 137 mEq/L (ref 135–145)

## 2012-09-29 LAB — CBC
HCT: 44.7 % (ref 39.0–52.0)
Hemoglobin: 16.1 g/dL (ref 13.0–17.0)
MCHC: 36 g/dL (ref 30.0–36.0)
RBC: 4.88 MIL/uL (ref 4.22–5.81)

## 2012-09-29 LAB — HEPARIN LEVEL (UNFRACTIONATED): Heparin Unfractionated: 0.64 IU/mL (ref 0.30–0.70)

## 2012-09-29 SURGERY — PERCUTANEOUS CORONARY STENT INTERVENTION (PCI-S)

## 2012-09-29 MED ORDER — LISINOPRIL 10 MG PO TABS
10.0000 mg | ORAL_TABLET | Freq: Every day | ORAL | Status: DC
  Administered 2012-09-29: 19:00:00 10 mg via ORAL
  Filled 2012-09-29 (×2): qty 1

## 2012-09-29 MED ORDER — DIAZEPAM 2 MG PO TABS
ORAL_TABLET | ORAL | Status: AC
  Filled 2012-09-29: qty 1

## 2012-09-29 MED ORDER — OXYCODONE-ACETAMINOPHEN 5-325 MG PO TABS: 1.0000 | ORAL_TABLET | ORAL | Status: DC | PRN

## 2012-09-29 MED ORDER — HYDRALAZINE HCL 20 MG/ML IJ SOLN: 10.0000 mg | Freq: Four times a day (QID) | INTRAMUSCULAR | Status: DC | PRN

## 2012-09-29 MED ORDER — FENTANYL CITRATE 0.05 MG/ML IJ SOLN
INTRAMUSCULAR | Status: AC
  Filled 2012-09-29: qty 2

## 2012-09-29 MED ORDER — ACETAMINOPHEN 325 MG PO TABS: 650.0000 mg | ORAL_TABLET | ORAL | Status: DC | PRN

## 2012-09-29 MED ORDER — TICAGRELOR 90 MG PO TABS
ORAL_TABLET | ORAL | Status: AC
  Filled 2012-09-29: qty 2

## 2012-09-29 MED ORDER — INSULIN ASPART 100 UNIT/ML ~~LOC~~ SOLN
0.0000 [IU] | Freq: Three times a day (TID) | SUBCUTANEOUS | Status: DC
  Administered 2012-09-29 – 2012-09-30 (×2): 2 [IU] via SUBCUTANEOUS

## 2012-09-29 MED ORDER — BIVALIRUDIN 250 MG IV SOLR
INTRAVENOUS | Status: AC
  Filled 2012-09-29: qty 250

## 2012-09-29 MED ORDER — HEPARIN (PORCINE) IN NACL 2-0.9 UNIT/ML-% IJ SOLN
INTRAMUSCULAR | Status: AC
  Filled 2012-09-29: qty 1000

## 2012-09-29 MED ORDER — TICAGRELOR 90 MG PO TABS
90.0000 mg | ORAL_TABLET | Freq: Two times a day (BID) | ORAL | Status: DC
  Administered 2012-09-29 – 2012-09-30 (×2): 90 mg via ORAL
  Filled 2012-09-29 (×3): qty 1

## 2012-09-29 MED ORDER — MIDAZOLAM HCL 5 MG/5ML IJ SOLN
INTRAMUSCULAR | Status: AC
  Filled 2012-09-29: qty 5

## 2012-09-29 MED ORDER — SODIUM CHLORIDE 0.9 % IV SOLN
1.0000 mL/kg/h | INTRAVENOUS | Status: AC
  Administered 2012-09-29: 1 mL/kg/h via INTRAVENOUS

## 2012-09-29 MED ORDER — LIDOCAINE HCL (PF) 1 % IJ SOLN
INTRAMUSCULAR | Status: AC
  Filled 2012-09-29: qty 30

## 2012-09-29 MED ORDER — ONDANSETRON HCL 4 MG/2ML IJ SOLN: 4.0000 mg | Freq: Four times a day (QID) | INTRAMUSCULAR | Status: DC | PRN

## 2012-09-29 MED ORDER — NITROGLYCERIN IN D5W 200-5 MCG/ML-% IV SOLN
5.0000 ug/min | INTRAVENOUS | Status: DC
  Administered 2012-09-29: 5 ug/min via INTRAVENOUS
  Filled 2012-09-29: qty 250

## 2012-09-29 MED ORDER — ASPIRIN 81 MG PO CHEW
81.0000 mg | CHEWABLE_TABLET | Freq: Every day | ORAL | Status: DC
  Administered 2012-09-30: 09:00:00 81 mg via ORAL
  Filled 2012-09-29: qty 1

## 2012-09-29 NOTE — Progress Notes (Signed)
ANTICOAGULATION CONSULT NOTE - Follow Up Consult  Pharmacy Consult for heparin Indication: chest pain/ACS  Labs:  Recent Labs  09/27/12 1445  09/27/12 1808 09/27/12 2047  09/28/12 0610 09/28/12 0830 09/28/12 1325 09/28/12 2005 09/29/12 0210 09/29/12 0211  HGB 15.8  --   --  15.5  --  16.0  --   --   --  16.1  --   HCT 44.2  --   --  42.9  --  45.4  --   --   --  44.7  --   PLT 205  --   --  234  --  239  --   --   --  204  --   APTT  --   --  36  --   --   --   --   --   --   --   --   LABPROT  --   --  12.7 13.2  --   --   --   --   --   --   --   INR  --   --  0.96 1.01  --   --   --   --   --   --   --   HEPARINUNFRC  --   --   --   --   < > 0.24*  --  0.17* 0.22*  --  0.64  CREATININE 0.99  --   --  0.91  --  0.94  --   --   --   --   --   TROPONINI  --   < >  --   --   --   --  8.51* 6.37* 2.54*  --   --   < > = values in this interval not displayed.   Assessment/Plan:  57yo male now therapeutic on heparin after multiple rate increases.  Will continue gtt at current rate and confirm stable with additional level.  Vernard Gambles, PharmD, BCPS  09/29/2012,2:44 AM

## 2012-09-29 NOTE — Progress Notes (Signed)
Utilization Review Completed.Adonia Porada T3/31/2014  

## 2012-09-29 NOTE — Interval H&P Note (Signed)
History and Physical Interval Note:  09/29/2012 7:56 AM  Samuel Wallace  has presented today for surgery, with the diagnosis of NSTEMI  The various methods of treatment have been discussed with the patient and family. After consideration of risks, benefits and other options for treatment, the patient has consented to  Procedure(s): LEFT HEART CATHETERIZATION WITH CORONARY ANGIOGRAM (N/A) as a surgical intervention .  The patient's history has been reviewed, patient examined, no change in status, stable for surgery.  I have reviewed the patient's chart and labs.  Questions were answered to the patient's satisfaction.     Maleny Candy R

## 2012-09-29 NOTE — Discharge Summary (Signed)
Physician Discharge Summary  Samuel Wallace ZOX:[id number] DOB: [birth year] DOA: 09/27/2012  PCP: Maryelizabeth Rowan, MD  Admit date: 09/27/2012 Discharge date: 09/30/2012  Time spent: 40 minutes  Recommendations for Outpatient Follow-up:  1. Follow up with primary MD. 2. Follow up with Dr Armanda Magic, cardiologist.   Discharge Diagnoses:  Principal Problem:   Chest pain Active Problems:   DM (diabetes mellitus)   HTN (hypertension)   CAD (coronary artery disease)   Discharge Condition: Satisfactory.   Diet recommendation: Heart-Healthy/Carbohydrate-Modified.   Filed Weights   09/27/12 1830 09/27/12 2029 09/30/12 0015  Weight: 80.74 kg (178 lb) 78.8 kg (173 lb 11.6 oz) 76.5 kg (168 lb 10.4 oz)    History of present illness:  This is a 57 year old male, with known history of DM, Retinal detachment right eye in 1999, reepaired at [facility], Retinal break and vitreous hemorrhage in the left eye, s/p vitrectomy/photocoagulation 08/2001, otherwise, no other significant medical problems, presenting with chest pain. According to him, at about 11:00 AM on 09/27/12, while driving, he developed retrosternal discomfort, which was initially mild, and felt like "indigestion" He felt that he needed some food, so stopped and had a meal, with some amelioration of his chest discomfort, followed by worsening.He drove home, with the discomfort increasing steadily, and by the time he arrived home at 12:30 PM, pain was radiating into both jaws, he was sweaty and clammy. His spouse gave him 4 ASA, and brought him to the ED. By 2:00 PM, he was completely pain-free. Initial POC markers, showed troponin of 0.16. Admitted for further management.    Hospital Course:  1. Chest pain/NSTEMI: Patient presented with retrosternal chest pain, radiating to both sides of jaw, and associated with diaphoresis. CXR was devoid of acute disease, 12-lead EKG showed SR, old Q-waves in V1-V3, and flattening of T-Waves in V5-V6. POC  troponin was elevated at 0.16, and formal troponin markedly elevated at 3.89. Dr Armanda Magic provided cardiology consultation, and patient was commenced on ivi Heparin, ivi NTG, ASA and beta-blocker. He remained asymptomatic thereafter, with no further chest pain, and Dr Verdis Prime performed cardiac catheterization on 09/29/12, with deployment of DES stents x 2, to address demonstrated 99% ramus intermedius and 90% OM-1 stenoses. Post-procedure condition was satisfactory.  2. DM (diabetes mellitus): This is type 2, and per patient, his HBA1C was 5.5 about 2 months ago. Random glucose was 236 at presentation. Metformin and Glipizide were placed on hold, and he was managed with diet/SSI during his hospitalization, with excellent control. HBA1C is 5.9. Patient has been recommenced on pre-admission oral hypoglycemics, at discharge.  3. HTN (hypertension): Patient has no known previous history of HTN, but BP was sub-optimal at 161/89-148/92 on admission. BP was controlled on beta-blocker and iv NTG in the initial part of his hospitalization, and with discontinuation of iv NTG, ACE-i was added.    Procedures:  See Below.   Consultations: Dr Armanda Magic, cardiologist.  Dr Verdis Prime, cardiologist.    Discharge Exam: Filed Vitals:   09/29/12 1800 09/29/12 2105 09/30/12 0015 09/30/12 0555  BP: 172/106 159/94 147/98 140/92  Pulse:  62 73 62  Temp:  97.5 F (36.4 C) 98.3 F (36.8 C) 97.5 F (36.4 C)  TempSrc:  Axillary Oral Oral  Resp:      Height:      Weight:   76.5 kg (168 lb 10.4 oz)   SpO2:  98% 97% 95%   General: Comfortable, alert, communicative, fully oriented, talking in complete sentences, not  short of breath at rest.  HEENT: No clinical pallor, no jaundice, no conjunctival injection or discharge. Hydration is fair.  NECK: Supple, JVP not seen, no carotid bruits, no palpable lymphadenopathy, no palpable goiter.  CHEST: Clinically clear to auscultation, no wheezes, no crackles.   HEART: Sounds 1 and 2 heard, normal, regular, no murmurs.  ABDOMEN: Full, soft, non-tender, no palpable organomegaly, no palpable masses, normal bowel sounds.  GENITALIA: Not examined.  LOWER EXTREMITIES: No pitting edema, palpable peripheral pulses. No evidence of hematoma right upper thigh.  MUSCULOSKELETAL SYSTEM: Unremarkable.  CENTRAL NERVOUS SYSTEM: No focal neurologic deficit on gross examination.  Discharge Instructions      Discharge Orders   Future Orders Complete By Expires     Amb Referral to Cardiac Rehabilitation  As directed     Diet - low sodium heart healthy  As directed     Diet - low sodium heart healthy  As directed     Driving Restrictions  As directed     Comments:      No driving for 2 weeks    Increase activity slowly  As directed     Increase activity slowly  As directed     Lifting restrictions  As directed     Comments:      No lifting more than 10 pounds for 1 week        Medication List    STOP taking these medications       aspirin EC 81 MG tablet      TAKE these medications       aspirin 81 MG chewable tablet  Chew 1 tablet (81 mg total) by mouth daily.     glipiZIDE 5 MG tablet  Commonly known as:  GLUCOTROL  Take 5 mg by mouth daily.     lisinopril 20 MG tablet  Commonly known as:  PRINIVIL,ZESTRIL  Take 1 tablet (20 mg total) by mouth daily.     metFORMIN 500 MG tablet  Commonly known as:  GLUCOPHAGE  Take 1 tablet (500 mg total) by mouth 2 (two) times daily with a meal. Do not restart Metformin until 4/3     metoprolol tartrate 25 MG tablet  Commonly known as:  LOPRESSOR  Take 1 tablet (25 mg total) by mouth 2 (two) times daily.     rosuvastatin 20 MG tablet  Commonly known as:  CRESTOR  Take 1 tablet (20 mg total) by mouth daily.     Ticagrelor 90 MG Tabs tablet  Commonly known as:  BRILINTA  Take 1 tablet (90 mg total) by mouth 2 (two) times daily.       Follow-up Information   Follow up with FERGUSON,CYNTHIA A,  NP On 10/14/2012. (at 10:30am)    Contact information:   [facility] [address] [place], [address] [place] [address] [phone]       Follow up with Quintella Reichert, MD On 11/11/2012. (Dr. Norris Cross office for lab for cholesterol panel- please be fasting for 12 hours)    Contact information:   [address] [place] [address] [phone]       Schedule an appointment as soon as possible for a visit with [facility], MD.   Contact information:   [address] [place] [address] [place] [address] [phone]        The results of significant diagnostics from this hospitalization (including imaging, microbiology, ancillary and laboratory) are listed below for reference.  Significant Diagnostic Studies: Dg Chest 2 View  09/27/2012  *RADIOLOGY REPORT*  Clinical Data: Chest pressure, jaw pain  CHEST - 2 VIEW  Comparison: 09/02/2009  Findings: Normal heart size and vascularity.  Mild hyperinflation. No focal pneumonia, collapse, consolidation, effusion or pneumothorax.  Chronic apical scarring.  Stable exam.  IMPRESSION: Stable chest exam.  No superimposed acute process   Original Report Authenticated By: Judie Petit. Miles Costain, M.D.     Microbiology: Recent Results (from the past 240 hour(s))  MRSA PCR SCREENING     Status: None   Collection Time    09/27/12  8:25 PM      Result Value Range Status   MRSA by PCR NEGATIVE  NEGATIVE Final   Comment:            The GeneXpert MRSA Assay (FDA     approved for NASAL specimens     only), is one component of a     comprehensive MRSA colonization     surveillance program. It is not     intended to diagnose MRSA     infection nor to guide or     monitor treatment for     MRSA infections.     Labs: Basic Metabolic Panel:  Recent Labs Lab 09/27/12 1445 09/27/12 2047 09/28/12 0610 09/29/12 0210 09/30/12 0615  NA 136 138 140 137 139  K 4.3 4.0 4.2 4.2 3.7  CL 98 101 102 100 100  CO2 25 25  27 27 29   GLUCOSE 236* 115* 119* 133* 156*  BUN 18 17 16 13 13   CREATININE 0.99 0.91 0.94 1.02 1.02  CALCIUM 10.2 9.7 9.5 9.4 9.7   Liver Function Tests:  Recent Labs Lab 09/28/12 0610  AST 74*  ALT 33  ALKPHOS 60  BILITOT 0.6  PROT 7.2  ALBUMIN 4.2   No results found for this basename: LIPASE, AMYLASE,  in the last 168 hours No results found for this basename: AMMONIA,  in the last 168 hours CBC:  Recent Labs Lab 09/27/12 1445 09/27/12 2047 09/28/12 0610 09/29/12 0210 09/30/12 0615  WBC 11.8* 8.5 9.3 7.4 10.4  HGB 15.8 15.5 16.0 16.1 15.7  HCT 44.2 42.9 45.4 44.7 43.6  MCV 90.6 90.5 93.8 91.6 91.2  PLT 205 234 239 204 216   Cardiac Enzymes:  Recent Labs Lab 09/27/12 1741 09/28/12 0830 09/28/12 1325 09/28/12 2005  TROPONINI 3.89* 8.51* 6.37* 2.54*   BNP: BNP (last 3 results) No results found for this basename: PROBNP,  in the last 8760 hours CBG:  Recent Labs Lab 09/29/12 0705 09/29/12 1144 09/29/12 1537 09/29/12 2109 09/30/12 0808  GLUCAP 148* 149* 181* 187* 161*       Signed:  Treyshaun Wallace,Samuel Wallace  [facility] 09/30/2012, 9:19 AM

## 2012-09-29 NOTE — H&P (View-Only) (Signed)
Admit date: 09/27/2012 Referring Physician  Dr. Oti Primary Physician  Dr. Dewey Primary Cardiologist  NONE Reason for Consultation  Chest pain  HPI: This is a 57 year old male, with known history of DM, Retinal detachment right eye in 1999, repaired at [facility], Retinal break and vitreous hemorrhage in the left eye, s/p vitrectomy/photocoagulation 08/2001, otherwise, no other significant medical problems, presenting with chest pain. Patient is a good historian, ably assisted by his spouse who was in the ED, and according to him, at about 11:00 AM today, while driving, he developed some retrosternal discomfort, which was initially mild, and felt like pressure like "indigestion".   He felt that he needed some food, so stopped and had a meal, with some amelioration of his chest discomfort, followed by worsening.He drove home, with the discomfort increasing steadily, and by the time he arrived home at 12:30 PM, pain was radiating into both jaws, he was sweaty and clammy. His spouse gave him 4 ASA, and brought him to the ED. By 2:00 PM, he was completely pain-free. Initial POC markers, showed troponin of 0.16. Cardiology is now asked to evaluate.  Currently he is pain free. He denies any chest pain.      PMH:   Past Medical History  Diagnosis Date  . Diabetes mellitus without complication      PSH:  vitrectomy/photocoagulation 08/2001  Allergies:  Review of patient's allergies indicates no known allergies. Prior to Admit Meds:   (Not in a hospital admission) Fam HX:   History reviewed. No pertinent family history. Social HX:    History   Social History  . Marital Status: Married    Spouse Name: N/A    Number of Children: N/A  . Years of Education: N/A   Occupational History  . Not on file.   Social History Main Topics  . Smoking status: Never smoked  . Smokeless tobacco: No  . Alcohol Use: No  . Drug Use: No  . Sexually Active: Not on file   Other Topics Concern  . Not on file    Social History Narrative  . No narrative on file     ROS:  All 11 ROS were addressed and are negative except what is stated in the HPI  Physical Exam: Blood pressure 148/92, pulse 89, temperature 98.3 F (36.8 C), temperature source Oral, resp. rate 16, SpO2 97.00%.    General: Well developed, well nourished, in no acute distress Head: Eyes PERRLA, No xanthomas.   Normal cephalic and atramatic  Lungs:   Clear bilaterally to auscultation and percussion. Heart:   HRRR S1 S2 Pulses are 2+ & equal.            No carotid bruit. No JVD.  No abdominal bruits. No femoral bruits. Abdomen: Bowel sounds are positive, abdomen soft and non-tender without masses Extremities:   No clubbing, cyanosis or edema.  DP +1 Neuro: Alert and oriented X 3. Psych:  Good affect, responds appropriately    Labs:   Lab Results  Component Value Date   WBC 11.8* 09/27/2012   HGB 15.8 09/27/2012   HCT 44.2 09/27/2012   MCV 90.6 09/27/2012   PLT 205 09/27/2012    Recent Labs Lab 09/27/12 1445  NA 136  K 4.3  CL 98  CO2 25  BUN 18  CREATININE 0.99  CALCIUM 10.2  GLUCOSE 236*   No results found for this basename: PTT   Lab Results  Component Value Date   INR 1.10 09/02/2009         Radiology:  Dg Chest 2 View  09/27/2012  *RADIOLOGY REPORT*  Clinical Data: Chest pressure, jaw pain  CHEST - 2 VIEW  Comparison: 09/02/2009  Findings: Normal heart size and vascularity.  Mild hyperinflation. No focal pneumonia, collapse, consolidation, effusion or pneumothorax.  Chronic apical scarring.  Stable exam.  IMPRESSION: Stable chest exam.  No superimposed acute process   Original Report Authenticated By: M. Shick, M.D.     EKG:  NSR with septal infarct and nonspecific T wave changes  ASSESSMENT:  1.  Acute coronary syndrome with intial POC troponin elevated c/w NSTEMI - currently pain free 2.  DM 3.  HTN  PLAN:   1.  Continue to cycle cardiac enzymes 2.  Continue ACE I/ASA/IV Heparin gtt 3.  Hold  Metformin 4.  Start Lopressor 25mg BID 5.  NPO after midnight 3/30 for cath in am 3/31   Marselino Slayton R, MD  09/27/2012  6:10 PM    

## 2012-09-29 NOTE — Progress Notes (Signed)
[facility] PROGRESS NOTE  QUARTEZ LAGOS AVW:[id number] DOB: July 30, 1955 DOA: 09/27/2012 PCP: Maryelizabeth Rowan, MD  Assessment/Plan: Principal Problem:   Chest pain Active Problems:   DM (diabetes mellitus)   HTN (hypertension)   CAD (coronary artery disease)    1. Chest pain/NSTEMI: Patient presented with retrosternal chest pain, radiating to both sides of jaw, and associated with diaphoresis. CXR was devoid of acute disease, 12-lead EKG showed SR, old Q-waves in V1-V3, and flattening of T-Waves in V5-V6. POC troponin was elevated at 0.16, and formal troponin markedly elevated at 3.89. Dr Armanda Magic provided cardiology consultation, and patient was commenced on ivi Heparin, ivi NTG, ASA and beta-blocker. He remained asymptomatic thereafter, with no further chest pain, and Dr Verdis Prime performed cardiac catheterization on 09/29/12, with deployment of DES stents x 2, to address demonstrated 99% ramus intermedius and 90% OM-1 stenoses. Post-procedure condition is satisfactory.  2. DM (diabetes mellitus): This is type 2, and per patient, his HBA1C was 5.5 about 2 months ago. Random glucose was 236 at presentation. Metformin and Glipizide are on hold. Managing with diet/SSI, with excellent control. HBA1C is 5.9. Patient will be recommenced on pre-admission oral hypoglycemics, at discharge.  3. HTN (hypertension): Patient has no known previous history of HTN, but BP was sub-optimal at 161/89-148/92 on admission. Now controlled on beta-blocker/NTG. Perhaps, ACE-i will be considered at discharge, particularly, as NTG will be discontinued. .    Code Status: Full Code.  Family Communication:  Disposition Plan: To be determined.    Brief narrative: This is a 57 year old male, with known history of DM, Retinal detachment right eye in 1999, reepaired at [facility], Retinal break and vitreous hemorrhage in the left eye, s/p vitrectomy/photocoagulation 08/2001, otherwise, no other significant medical  problems, presenting with chest pain. According to him, at about 11:00 AM on 09/27/12, while driving, he developed retrosternal discomfort, which was initially mild, and felt like "indigestion" He felt that he needed some food, so stopped and had a meal, with some amelioration of his chest discomfort, followed by worsening.He drove home, with the discomfort increasing steadily, and by the time he arrived home at 12:30 PM, pain was radiating into both jaws, he was sweaty and clammy. His spouse gave him 4 ASA, and brought him to the ED. By 2:00 PM, he was completely pain-free. Initial POC markers, showed troponin of 0.16. Admitted for further management.    Consultants:  Dr Armanda Magic, cardiologist.   Procedures:  CXR.   Antibiotics:  N/A.   HPI/Subjective: Asymptomatic.   Objective: Vital signs in last 24 hours: Temp:  [97.6 F (36.4 C)-98.5 F (36.9 C)] 97.8 F (36.6 C) (03/31 1201) Pulse Rate:  [63-66] 65 (03/31 0739) Resp:  [14-16] 14 (03/30 2346) BP: (132-144)/(79-98) 132/90 mmHg (03/31 0351) SpO2:  [94 %-98 %] 98 % (03/31 0351) Weight change:  Last BM Date: 09/26/12  Intake/Output from previous day: 03/30 0701 - 03/31 0700 In: 1947.6 [P.O.:1215; I.V.:732.6] Out: 400 [Urine:400]     Physical Exam: General: Comfortable, alert, communicative, fully oriented, talking in complete sentences, not short of breath at rest.  HEENT: No clinical pallor, no jaundice, no conjunctival injection or discharge. Hydration is fair.  NECK: Supple, JVP not seen, no carotid bruits, no palpable lymphadenopathy, no palpable goiter.  CHEST: Clinically clear to auscultation, no wheezes, no crackles.  HEART: Sounds 1 and 2 heard, normal, regular, no murmurs.  ABDOMEN: Full, soft, non-tender, no palpable organomegaly, no palpable masses, normal bowel sounds.  GENITALIA: Not  examined.  LOWER EXTREMITIES: No pitting edema, palpable peripheral pulses. No evidence of hematoma right upper thigh.   MUSCULOSKELETAL SYSTEM: Unremarkable.  CENTRAL NERVOUS SYSTEM: No focal neurologic deficit on gross examination.  Lab Results:  Recent Labs  09/28/12 0610 09/29/12 0210  WBC 9.3 7.4  HGB 16.0 16.1  HCT 45.4 44.7  PLT 239 204    Recent Labs  09/28/12 0610 09/29/12 0210  NA 140 137  K 4.2 4.2  CL 102 100  CO2 27 27  GLUCOSE 119* 133*  BUN 16 13  CREATININE 0.94 1.02  CALCIUM 9.5 9.4   Recent Results (from the past 240 hour(s))  MRSA PCR SCREENING     Status: None   Collection Time    09/27/12  8:25 PM      Result Value Range Status   MRSA by PCR NEGATIVE  NEGATIVE Final   Comment:            The GeneXpert MRSA Assay (FDA     approved for NASAL specimens     only), is one component of a     comprehensive MRSA colonization     surveillance program. It is not     intended to diagnose MRSA     infection nor to guide or     monitor treatment for     MRSA infections.     Studies/Results: Dg Chest 2 View  09/27/2012  *RADIOLOGY REPORT*  Clinical Data: Chest pressure, jaw pain  CHEST - 2 VIEW  Comparison: 09/02/2009  Findings: Normal heart size and vascularity.  Mild hyperinflation. No focal pneumonia, collapse, consolidation, effusion or pneumothorax.  Chronic apical scarring.  Stable exam.  IMPRESSION: Stable chest exam.  No superimposed acute process   Original Report Authenticated By: Judie Petit. Miles Costain, M.D.     Medications: Scheduled Meds: . [START ON 09/30/2012] aspirin  81 mg Oral Daily  . latanoprost  1 drop Right Eye QHS  . metoprolol tartrate  25 mg Oral BID  . Ticagrelor  90 mg Oral BID   Continuous Infusions: . sodium chloride 1 mL/kg/hr (09/29/12 1006)  . nitroGLYCERIN     PRN Meds:.acetaminophen, ondansetron (ZOFRAN) IV, oxyCODONE-acetaminophen    LOS: 2 days   Iosefa Weintraub,CHRISTOPHER  [facility] Pager [phone]. If 8PM-8AM, please contact night-coverage at [email or web address], password [facility] 09/29/2012, 2:25 PM  LOS: 2 days

## 2012-09-29 NOTE — CV Procedure (Signed)
   PERCUTANEOUS CORONARY INTERVENTION   Samuel Wallace is a 57 y.o. male  INDICATION: Acute coronary syndrome with elevated cardiac markers, unstable angina based upon history and now documented high grade disease in the ramus intermedius branch (culprit) and 90+ percent stenosis in the first obtuse marginal. Both lesions are within diffuse segments. After discussion with Dr. Mayford Knife and considering the patient's presentation, ad hoc PCI with stenting in both sides was felt to be indicated.   PROCEDURE: 1. DES implantation ramus intermedius  2. DES implantation obtuse marginal #1   CONSENT: The risks, benefits, and details of the procedure were explained to the patient. Risks including death, MI, stroke, bleeding, limb ischemia, renal failure and allergy were described and accepted by the patient.  Informed written consent was obtained prior to proceeding.  PROCEDURE TECHNIQUE:  After Xylocaine anesthesia a 6 French sheath was exchanged for the 5 French sheath was placed in the right femoral artery during the diagnostic procedure to.   Coronary guiding shots were made using a XB LAD 6 French 3.5 cm guide catheter. Antithrombotic therapy, Angiomax bolus and infusion, was begun and determined to be therapeutic by ACT. Antiplatelet therapy, Brilinta 190 mg, was loaded.  Both the ramus intermedius and the first obtuse marginal were wired. A BMW placed down the ramus and an [employer] down the obtuse marginal. Predilatation was then performed using a 2.0 x 12 mm long balloon in the ramus branch. We then positioned and deployed a 2.75 x 12 mm long Promus Premier DES. Postdilatation was performed using a 3.0 by an 8 mm long [place] balloon to 13 atmospheres.  We then turned attention to the ramus intermedius. Predilatation was performed using the 2.0 x 12 mm long balloon. We then positioned and deployed a 2.25 x 12 mm long Promus Premier DES to 11 atmospheres. Postdilatation was performed using a 2.25  x 9 mm long [place] balloon to 13 atmospheres.  Post PCI images demonstrated acceptable results within the diffusely diseased segments noted.  Mynks percutaneous closure device was used with good hemostasis.  CONTRAST:  Total of  100 cc.  COMPLICATIONS:  None.    ANGIOGRAPHIC RESULTS:   The 99% ramus intermedius stenosis was reduced to 0% with TIMI grade 3 flow.  The segmental 90% stenosis in the first obtuse marginal was reduced to 0% with TIMI grade 3 flow.   IMPRESSIONS:  1. Successful DES implantation ramus intermedius with TIMI 3 flow noted post procedure 0% in lesion stenosis.  2. Successful DES implantation and a small obtuse marginal branch reducing the stenosis to 0% and resultant TIMI grade 3 flow noted.   RECOMMENDATION:  Further management per Dr. Mayford Knife. Brilinta and aspirin for 12 months.Marland Kitchen    Lesleigh Noe, MD 09/29/2012 9:41 AM

## 2012-09-29 NOTE — CV Procedure (Signed)
PROCEDURE:  Left heart catheterization with selective coronary angiography, left ventriculogram.  INDICATIONS:  NSTEMI  The risks, benefits, and details of the procedure were explained to the patient.  The patient verbalized understanding and wanted to proceed.  Informed written consent was obtained.  PROCEDURE TECHNIQUE:  After Xylocaine anesthesia a 76F sheath was placed in the right femoral artery with a single anterior needle wall stick.   Left coronary angiography was done using a Judkins L4 guide catheter.  Right coronary angiography was done using a Judkins R4 guide catheter.  Left ventriculography was done using a pigtail catheter.    CONTRAST:  Total of 70cc.  COMPLICATIONS:  None.    HEMODYNAMICS:  Aortic pressure was 124/56mmHg; LV pressure was 120/4mmHg; LVEDP .  There was no gradient between the left ventricle and aorta.    ANGIOGRAPHIC DATA:   The left main coronary artery is calcified and trifurcates into an LAD, left circumflex artery and Ramus.  The left anterior descending artery is patent in throughout its course with luminal irregularities.  It gives rise to a small diagonal #1 and then a second diagonal.   The left circumflex artery is patent in the ostium.  It then immediately gives rise to a large OM1 which has a proximal 80% stenosis.  The rest of the OM1 is widely patent.  The left circumflex is widely patent throughout the rest of its course with luminal irregularities.    The ramus is a very large vessel with a 70-80% stenosis in the proximal portion with ? Thrombus.  The ongoing ramus is widely patent.  The right coronary artery is widely patent with luminal irregularities.  Distally it gives rise to a PDA and PL branches which are patent.  LEFT VENTRICULOGRAM:  Left ventricular angiogram was done in the 30 RAO projection and revealed normal left ventricular wall motion and systolic function with an estimated ejection fraction of 55%%.  LVEDP was 19  mmHg.  IMPRESSIONS:  1. Normal left main coronary artery. 2. Normal left anterior descending artery and its branches with luminal irregularities. 3. 80% stenosis of large OM1 and 70-80% stenosis of large ramus 4. Normal right coronary artery with luminal irregularities. 5. Normal left ventricular systolic function.  LVEDP 19 mmHg.  Ejection fraction 55%.  RECOMMENDATION:   1.  PCI of Ramus and OM#1 by Dr. Katrinka Blazing.

## 2012-09-29 NOTE — Progress Notes (Signed)
SUBJECTIVE:  No further CP  OBJECTIVE:   Vitals:   Filed Vitals:   09/28/12 1945 09/28/12 2101 09/28/12 2346 09/29/12 0351  BP: 144/79 140/92 135/98 132/90  Pulse:  63    Temp: 98.5 F (36.9 C)  98.3 F (36.8 C) 97.6 F (36.4 C)  TempSrc: Axillary  Oral Oral  Resp: 15  14   Height:      Weight:      SpO2: 94%  96% 98%   I&O's:   Intake/Output Summary (Last 24 hours) at 09/29/12 2956 Last data filed at 09/29/12 0600  Gross per 24 hour  Intake 1923.12 ml  Output    400 ml  Net 1523.12 ml   TELEMETRY: Reviewed telemetry pt in NSR:     PHYSICAL EXAM General: Well developed, well nourished, in no acute distress Head: Eyes PERRLA, No xanthomas.   Normal cephalic and atramatic  Lungs:   Clear bilaterally to auscultation and percussion. Heart:   HRRR S1 S2 Pulses are 2+ & equal. Abdomen: Bowel sounds are positive, abdomen soft and non-tender without masses Extremities:   No clubbing, cyanosis or edema.  DP +1 Neuro: Alert and oriented X 3. Psych:  Good affect, responds appropriately   LABS: Basic Metabolic Panel:  Recent Labs  21/30/86 0610 09/29/12 0210  NA 140 137  K 4.2 4.2  CL 102 100  CO2 27 27  GLUCOSE 119* 133*  BUN 16 13  CREATININE 0.94 1.02  CALCIUM 9.5 9.4   Liver Function Tests:  Recent Labs  09/28/12 0610  AST 74*  ALT 33  ALKPHOS 60  BILITOT 0.6  PROT 7.2  ALBUMIN 4.2   No results found for this basename: LIPASE, AMYLASE,  in the last 72 hours CBC:  Recent Labs  09/28/12 0610 09/29/12 0210  WBC 9.3 7.4  HGB 16.0 16.1  HCT 45.4 44.7  MCV 93.8 91.6  PLT 239 204   Cardiac Enzymes:  Recent Labs  09/28/12 0830 09/28/12 1325 09/28/12 2005  TROPONINI 8.51* 6.37* 2.54*   BNP: No components found with this basename: POCBNP,  D-Dimer: No results found for this basename: DDIMER,  in the last 72 hours Hemoglobin A1C:  Recent Labs  09/27/12 1808  HGBA1C 5.9*   Coag Panel:   Lab Results  Component Value Date   INR 1.01  09/27/2012   INR 0.96 09/27/2012   INR 1.10 09/02/2009    RADIOLOGY: Dg Chest 2 View  09/27/2012  *RADIOLOGY REPORT*  Clinical Data: Chest pressure, jaw pain  CHEST - 2 VIEW  Comparison: 09/02/2009  Findings: Normal heart size and vascularity.  Mild hyperinflation. No focal pneumonia, collapse, consolidation, effusion or pneumothorax.  Chronic apical scarring.  Stable exam.  IMPRESSION: Stable chest exam.  No superimposed acute process   Original Report Authenticated By: Judie Petit. Shick, M.D.    ASSESSMENT:  1. Acute coronary syndrome with intial POC troponin elevated c/w NSTEMI - currently pain free  2. DM  3. HTN  PLAN:  1. Continue ACE I/ASA/IV Heparin gtt/beta blocker/IV NTG  2. Hold Metformin  3. Cath today 4. Cardiac catheterization was discussed with the patient fully including risks on myocardial infarction, death, stroke, bleeding, arrhythmia, dye allergy, renal insufficiency or bleeding. All patient questions and concerns were discussed and the patient understands and is willing to proceed.       Quintella Reichert, MD  09/29/2012  8:29 AM

## 2012-09-30 DIAGNOSIS — I2510 Atherosclerotic heart disease of native coronary artery without angina pectoris: Secondary | ICD-10-CM

## 2012-09-30 HISTORY — DX: Atherosclerotic heart disease of native coronary artery without angina pectoris: I25.10

## 2012-09-30 LAB — CBC
HCT: 43.6 % (ref 39.0–52.0)
MCV: 91.2 fL (ref 78.0–100.0)
RDW: 13.2 % (ref 11.5–15.5)
WBC: 10.4 10*3/uL (ref 4.0–10.5)

## 2012-09-30 LAB — BASIC METABOLIC PANEL
BUN: 13 mg/dL (ref 6–23)
Chloride: 100 mEq/L (ref 96–112)
Creatinine, Ser: 1.02 mg/dL (ref 0.50–1.35)
GFR calc Af Amer: >90 mL/min (ref >90)

## 2012-09-30 MED ORDER — LISINOPRIL 20 MG PO TABS: 20.0000 mg | ORAL_TABLET | Freq: Every day | ORAL | Status: DC

## 2012-09-30 MED ORDER — ROSUVASTATIN CALCIUM 20 MG PO TABS: 20.0000 mg | ORAL_TABLET | Freq: Every day | ORAL | Status: DC

## 2012-09-30 MED ORDER — LISINOPRIL 20 MG PO TABS
20.0000 mg | ORAL_TABLET | Freq: Every day | ORAL | Status: DC
  Administered 2012-09-30: 09:00:00 20 mg via ORAL
  Filled 2012-09-30: qty 1

## 2012-09-30 MED ORDER — TICAGRELOR 90 MG PO TABS: 90.0000 mg | ORAL_TABLET | Freq: Two times a day (BID) | ORAL | Status: DC

## 2012-09-30 MED ORDER — LISINOPRIL 10 MG PO TABS: 10.0000 mg | ORAL_TABLET | Freq: Every day | ORAL | Status: DC

## 2012-09-30 MED ORDER — ASPIRIN 81 MG PO CHEW: 81.0000 mg | CHEWABLE_TABLET | Freq: Every day | ORAL | Status: AC

## 2012-09-30 MED ORDER — METFORMIN HCL 500 MG PO TABS: 500.0000 mg | ORAL_TABLET | Freq: Two times a day (BID) | ORAL | Status: DC

## 2012-09-30 MED ORDER — METOPROLOL TARTRATE 25 MG PO TABS: 25.0000 mg | ORAL_TABLET | Freq: Two times a day (BID) | ORAL | Status: DC

## 2012-09-30 MED FILL — Dextrose Inj 5%: INTRAVENOUS | Qty: 50 | Status: AC

## 2012-09-30 NOTE — Care Management Note (Signed)
    Page 1 of 1   09/30/2012     10:21:36 AM   CARE MANAGEMENT NOTE 09/30/2012  Patient:  Samuel Wallace, Samuel Wallace   Account Number:  [id number]  Date Initiated:  09/30/2012  Documentation initiated by:  [facility]  Subjective/Objective Assessment:   57 year old male, with known history of DM, Retinal detachment right eye in 1999, repaired at [facility]; presented today for surgery, with the diagnosis of NSTEMI     Action/Plan:   CM to do benefits check on Brilinta.  Will inform pt when complete   Anticipated DC Date:  09/30/2012   Anticipated DC Plan:  HOME/SELF CARE         Choice offered to / List presented to:             Status of service:   Medicare Important Message given?   (If response is "NO", the following Medicare IM given date fields will be blank) Date Medicare IM given:   Date Additional Medicare IM given:    Discharge Disposition:    Per UR Regulation:    If discussed at Long Length of Stay Meetings, dates discussed:    Comments:  09/30/12 @ 1000.Marland KitchenMarland KitchenOletta Cohn, RN, BSN, NCM, [phone] Spoke with pt concerning new medication (Brilinta) benefits check.  Pt states that he uses [facility] [place].  CM called pharmacy to confirm stock of Brilinta and check pt co-pay.  Pharm Tech states co-pay will be $18.00/ month after 30 day free card utilized.  Pt made aware.

## 2012-09-30 NOTE — Progress Notes (Signed)
SUBJECTIVE:  No chest pain or SOB  OBJECTIVE:   Vitals:   Filed Vitals:   09/29/12 1800 09/29/12 2105 09/30/12 0015 09/30/12 0555  BP: 172/106 159/94 147/98 140/92  Pulse:  62 73 62  Temp:  97.5 F (36.4 C) 98.3 F (36.8 C) 97.5 F (36.4 C)  TempSrc:  Axillary Oral Oral  Resp:      Height:      Weight:   76.5 kg (168 lb 10.4 oz)   SpO2:  98% 97% 95%   I&O's:   Intake/Output Summary (Last 24 hours) at 09/30/12 0802 Last data filed at 09/29/12 1900  Gross per 24 hour  Intake 1184.92 ml  Output      0 ml  Net 1184.92 ml   TELEMETRY: Reviewed telemetry pt in NSR:     PHYSICAL EXAM General: Well developed, well nourished, in no acute distress Head: Eyes PERRLA, No xanthomas.   Normal cephalic and atramatic  Lungs:   Clear bilaterally to auscultation and percussion. Heart:   HRRR S1 S2 Pulses are 2+ & equal. Abdomen: Bowel sounds are positive, abdomen soft and non-tender without masses Extremities:   No clubbing, cyanosis or edema.  DP +1 Neuro: Alert and oriented X 3. Psych:  Good affect, responds appropriately   LABS: Basic Metabolic Panel:  Recent Labs  16/10/96 0210 09/30/12 0615  NA 137 139  K 4.2 3.7  CL 100 100  CO2 27 29  GLUCOSE 133* 156*  BUN 13 13  CREATININE 1.02 1.02  CALCIUM 9.4 9.7   Liver Function Tests:  Recent Labs  09/28/12 0610  AST 74*  ALT 33  ALKPHOS 60  BILITOT 0.6  PROT 7.2  ALBUMIN 4.2   No results found for this basename: LIPASE, AMYLASE,  in the last 72 hours CBC:  Recent Labs  09/29/12 0210 09/30/12 0615  WBC 7.4 10.4  HGB 16.1 15.7  HCT 44.7 43.6  MCV 91.6 91.2  PLT 204 216   Cardiac Enzymes:  Recent Labs  09/28/12 0830 09/28/12 1325 09/28/12 2005  TROPONINI 8.51* 6.37* 2.54*   BNP: No components found with this basename: POCBNP,  D-Dimer: No results found for this basename: DDIMER,  in the last 72 hours Hemoglobin A1C:  Recent Labs  09/27/12 1808  HGBA1C 5.9*   Fasting Lipid  Panel:  Recent Labs  09/29/12 1519  CHOL 194  HDL 31*  LDLCALC 93  TRIG 045*  CHOLHDL 6.3   Thyroid Function Tests: No results found for this basename: TSH, T4TOTAL, FREET3, T3FREE, THYROIDAB,  in the last 72 hours Anemia Panel: No results found for this basename: VITAMINB12, FOLATE, FERRITIN, TIBC, IRON, RETICCTPCT,  in the last 72 hours Coag Panel:   Lab Results  Component Value Date   INR 1.01 09/27/2012   INR 0.96 09/27/2012   INR 1.10 09/02/2009    RADIOLOGY: Dg Chest 2 View  09/27/2012  *RADIOLOGY REPORT*  Clinical Data: Chest pressure, jaw pain  CHEST - 2 VIEW  Comparison: 09/02/2009  Findings: Normal heart size and vascularity.  Mild hyperinflation. No focal pneumonia, collapse, consolidation, effusion or pneumothorax.  Chronic apical scarring.  Stable exam.  IMPRESSION: Stable chest exam.  No superimposed acute process   Original Report Authenticated By: Judie Petit. Shick, M.D.    ASSESSMENT:  1. NSTEMI - s/p cath with high grade lesions in ramus and OM now s/p PCI of both with DES 2. DM  3. HTN   PLAN: 1.  Brilinta and ASA for 12 months  2.  Start Crestor 20mg  daily 3.  OK to d/c home from cardiac standpoint 4.  Followup with Cristopher Peru NP in my office in 2 weeks 5.  Statin panel in my office in 6 weeks     Quintella Reichert, MD  09/30/2012  8:02 AM

## 2012-09-30 NOTE — Progress Notes (Signed)
CARDIAC REHAB PHASE I   PRE:  Rate/Rhythm: 66SR  BP:  Supine: 157/84  Sitting:   Standing:    SaO2:   MODE:  Ambulation: 700 ft   POST:  Rate/Rhythm: 76SR  BP:  Supine:   Sitting: 160/91  Standing:    SaO2:  4098-1191 Pt walked 700 ft with steady gait. Tolerated well. Denied CP. Education completed with pt and daughter. Discussed CRP 2 and pt gave permission to refer to [facility] Phase 2.   Luetta Nutting, RN BSN  09/30/2012 8:38 AM

## 2012-10-22 ENCOUNTER — Telehealth (HOSPITAL_COMMUNITY): Payer: Self-pay | Admitting: Cardiac Rehabilitation

## 2012-10-22 NOTE — Telephone Encounter (Signed)
pc to pt to sign up for cardiac rehab.  Pt states he is not interested in the program because he is currently exercising on his own and feels program is not relevant for him. Benefits of cardiac rehab explained to pt. Pt states he will call back later if he changes his mind.  Dr. Mayford Knife made aware.

## 2013-06-13 ENCOUNTER — Encounter: Payer: Self-pay | Admitting: Cardiology

## 2013-06-13 DIAGNOSIS — E119 Type 2 diabetes mellitus without complications: Secondary | ICD-10-CM | POA: Insufficient documentation

## 2013-06-15 ENCOUNTER — Encounter: Payer: Self-pay | Admitting: General Surgery

## 2013-06-15 ENCOUNTER — Ambulatory Visit (INDEPENDENT_AMBULATORY_CARE_PROVIDER_SITE_OTHER): Payer: 59 | Admitting: Cardiology

## 2013-06-15 VITALS — BP 150/76 | HR 60 | Ht 71.0 in | Wt 173.0 lb

## 2013-06-15 DIAGNOSIS — E785 Hyperlipidemia, unspecified: Secondary | ICD-10-CM | POA: Insufficient documentation

## 2013-06-15 DIAGNOSIS — I2510 Atherosclerotic heart disease of native coronary artery without angina pectoris: Secondary | ICD-10-CM

## 2013-06-15 DIAGNOSIS — I10 Essential (primary) hypertension: Secondary | ICD-10-CM

## 2013-06-15 NOTE — Patient Instructions (Signed)
Your physician recommends that you continue on your current medications as directed. Please refer to the Current Medication list given to you today.  Your physician wants you to follow-up in: 6 Months with Dr Turner You will receive a reminder letter in the mail two months in advance. If you don't receive a letter, please call our office to schedule the follow-up appointment.  

## 2013-06-15 NOTE — Progress Notes (Signed)
[address] [place], [place]  [address] Phone: [phone] Fax:  [phone]  Date:  06/15/2013   ID:  FRASER BUSCHE, DOB 1955/07/30, MRN [id number]  PCP:  Maryelizabeth Rowan, MD  Cardiologist:  Armanda Magic, MD     History of Present Illness: Samuel Wallace is a 57 y.o. male with a history fo ASCAD, HTN and dyslipidemia who presents today for followup.  He is doing well.  He denies any chest pain, SOB, DOE, LE edema, dizziness, palpitations or syncope.     Wt Readings from Last 3 Encounters:  06/15/13 173 lb (78.472 kg)  09/30/12 168 lb 10.4 oz (76.5 kg)  09/30/12 168 lb 10.4 oz (76.5 kg)     Past Medical History  Diagnosis Date  . Diabetes mellitus without complication   . Hypertension   . CAD (coronary artery disease) 09/30/2012    with DES to Ramus and OM with + NSTEMI    Current Outpatient Prescriptions  Medication Sig Dispense Refill  . aspirin 81 MG chewable tablet Chew 1 tablet (81 mg total) by mouth daily.      Marland Kitchen glipiZIDE (GLUCOTROL) 5 MG tablet Take 5 mg by mouth daily.      Marland Kitchen glucose blood (ONE TOUCH TEST STRIPS) test strip Use as instructed      . latanoprost (XALATAN) 0.005 % ophthalmic solution 1 drop.      Marland Kitchen lisinopril (PRINIVIL,ZESTRIL) 20 MG tablet Take 1 tablet (20 mg total) by mouth daily.  30 tablet  11  . metFORMIN (GLUCOPHAGE) 500 MG tablet Take 1 tablet (500 mg total) by mouth 2 (two) times daily with a meal. Do not restart Metformin until 4/3      . metoprolol tartrate (LOPRESSOR) 25 MG tablet Take 1 tablet (25 mg total) by mouth 2 (two) times daily.  60 tablet  11  . nitroGLYCERIN (NITROSTAT) 0.4 MG SL tablet 0.4 mg.      . rosuvastatin (CRESTOR) 20 MG tablet Take 1 tablet (20 mg total) by mouth daily.  30 tablet  11  . Ticagrelor (BRILINTA) 90 MG TABS tablet Take 1 tablet (90 mg total) by mouth 2 (two) times daily.  60 tablet  11   No current facility-administered medications for this visit.    Allergies:   No Known  Allergies  Social History:  The patient  reports that he has never smoked. He does not have any smokeless tobacco history on file. He reports that he does not drink alcohol or use illicit drugs.   Family History:  The patient's family history is not on file.   ROS:  Please see the history of present illness.      All other systems reviewed and negative.   PHYSICAL EXAM: VS:  BP 150/76  Pulse 60  Ht 5\' 11"  (1.803 m)  Wt 173 lb (78.472 kg)  BMI 24.14 kg/m2 Well nourished, well developed, in no acute distress HEENT: normal Neck: no JVD Cardiac:  normal S1, S2; RRR; no murmur Lungs:  clear to auscultation bilaterally, no wheezing, rhonchi or rales Abd: soft, nontender, no hepatomegaly Ext: no edema Skin: warm and dry Neuro:  CNs 2-12 intact, no focal abnormalities noted       ASSESSMENT AND PLAN:  1. ASCAD with no angina  - continue ASA/Brilinta 2. HTN - mildly elevated today.  At home 125/16mmHg  - continue lisinopril/metoprolol 3. Dyslipidemia  - continue Crestor    - recheck lipids in 08/2013  Followup with  me in 6 months  Signed, Armanda Magic, MD 06/15/2013 4:26 PM

## 2013-08-28 ENCOUNTER — Ambulatory Visit (INDEPENDENT_AMBULATORY_CARE_PROVIDER_SITE_OTHER): Payer: 59 | Admitting: *Deleted

## 2013-08-28 DIAGNOSIS — I2510 Atherosclerotic heart disease of native coronary artery without angina pectoris: Secondary | ICD-10-CM

## 2013-08-28 DIAGNOSIS — I10 Essential (primary) hypertension: Secondary | ICD-10-CM

## 2013-08-28 DIAGNOSIS — E785 Hyperlipidemia, unspecified: Secondary | ICD-10-CM

## 2013-08-28 LAB — ALT: ALT: 43 U/L (ref 0–53)

## 2013-08-28 LAB — LIPID PANEL
Cholesterol: 90 mg/dL (ref 0–200)
HDL: 33.2 mg/dL — AB (ref >39.00)
LDL Cholesterol: 33 mg/dL (ref 0–99)
TRIGLYCERIDES: 121 mg/dL (ref 0.0–149.0)
Total CHOL/HDL Ratio: 3
VLDL: 24.2 mg/dL (ref 0.0–40.0)

## 2013-09-21 ENCOUNTER — Other Ambulatory Visit: Payer: Self-pay | Admitting: *Deleted

## 2013-09-21 MED ORDER — METOPROLOL TARTRATE 25 MG PO TABS: 25.0000 mg | ORAL_TABLET | Freq: Two times a day (BID) | ORAL | Status: DC

## 2013-09-21 MED ORDER — ROSUVASTATIN CALCIUM 20 MG PO TABS: 20.0000 mg | ORAL_TABLET | Freq: Every day | ORAL | Status: DC

## 2013-09-21 MED ORDER — TICAGRELOR 90 MG PO TABS: 90.0000 mg | ORAL_TABLET | Freq: Two times a day (BID) | ORAL | Status: DC

## 2013-09-21 MED ORDER — LISINOPRIL 20 MG PO TABS: 20.0000 mg | ORAL_TABLET | Freq: Every day | ORAL | Status: DC

## 2013-09-26 ENCOUNTER — Other Ambulatory Visit (HOSPITAL_COMMUNITY): Payer: Self-pay | Admitting: Cardiology

## 2013-12-04 ENCOUNTER — Telehealth: Payer: Self-pay | Admitting: Cardiology

## 2013-12-04 NOTE — Telephone Encounter (Signed)
Pt was seen by Dr. Mayford Knife in January this year, pt states received a letter two days ago to call this office for an appoitnemet  With Dr  Mayford Knife this June. Pt does not want a PA /NP. Pt would like for Dr. Norris Cross medical assistance to call him next week to see if she can place him on Dr. Mayford Knife scheduled for this month.

## 2013-12-04 NOTE — Telephone Encounter (Signed)
Follow up    Pt is calling to schedule in June per his recall letter.  Pt wants an appt in June does not want to see a PA.   Please give pt a call.

## 2013-12-14 NOTE — Telephone Encounter (Signed)
Samuel Wallace is working on getting pt scheduled. She will let me know when scheduled.

## 2013-12-15 NOTE — Telephone Encounter (Signed)
Pt was scheduled in July.

## 2013-12-30 ENCOUNTER — Ambulatory Visit (INDEPENDENT_AMBULATORY_CARE_PROVIDER_SITE_OTHER): Payer: 59 | Admitting: Cardiology

## 2013-12-30 ENCOUNTER — Encounter: Payer: Self-pay | Admitting: Cardiology

## 2013-12-30 VITALS — BP 110/78 | HR 52 | Ht 71.0 in | Wt 161.0 lb

## 2013-12-30 DIAGNOSIS — I10 Essential (primary) hypertension: Secondary | ICD-10-CM

## 2013-12-30 DIAGNOSIS — I2510 Atherosclerotic heart disease of native coronary artery without angina pectoris: Secondary | ICD-10-CM

## 2013-12-30 DIAGNOSIS — E785 Hyperlipidemia, unspecified: Secondary | ICD-10-CM

## 2013-12-30 NOTE — Progress Notes (Signed)
[address] [place], [place]  [address] Phone: [phone]) [phone] Fax:  [phone]  Date:  12/30/2013   ID:  Samuel HamperJames F Clinard, DOB [birth year], MRN [id number]  PCP:  Maryelizabeth RowanEWEY,ELIZABETH, MD  Cardiologist:  Armanda Magicraci Turner, MD     History of Present Illness: Samuel Wallace is a 58 y.o. male with a history fo ASCAD, HTN and dyslipidemia who presents today for followup. He is doing well. He denies any chest pain, SOB, DOE, LE edema, dizziness, palpitations or syncope.    Wt Readings from Last 3 Encounters:  12/30/13 161 lb (73.029 kg)  06/15/13 173 lb (78.472 kg)  09/30/12 168 lb 10.4 oz (76.5 kg)     Past Medical History  Diagnosis Date  . Diabetes mellitus without complication   . Hypertension   . CAD (coronary artery disease) 09/30/2012    with DES to Ramus and OM with + NSTEMI    Current Outpatient Prescriptions  Medication Sig Dispense Refill  . aspirin 81 MG chewable tablet Chew 1 tablet (81 mg total) by mouth daily.      Marland Kitchen. glipiZIDE (GLUCOTROL) 5 MG tablet Take 5 mg by mouth daily.      Marland Kitchen. latanoprost (XALATAN) 0.005 % ophthalmic solution 1 drop.      Marland Kitchen. lisinopril (PRINIVIL,ZESTRIL) 20 MG tablet Take 1 tablet (20 mg total) by mouth daily.  30 tablet  3  . metFORMIN (GLUCOPHAGE) 500 MG tablet Take 1 tablet (500 mg total) by mouth 2 (two) times daily with a meal. Do not restart Metformin until 4/3      . metoprolol tartrate (LOPRESSOR) 25 MG tablet Take 1 tablet (25 mg total) by mouth 2 (two) times daily.  60 tablet  3  . nitroGLYCERIN (NITROSTAT) 0.4 MG SL tablet 0.4 mg.      . rosuvastatin (CRESTOR) 20 MG tablet Take 1 tablet (20 mg total) by mouth daily.  30 tablet  3  . Ticagrelor (BRILINTA) 90 MG TABS tablet Take 1 tablet (90 mg total) by mouth 2 (two) times daily.  60 tablet  3   No current facility-administered medications for this visit.    Allergies:   No Known Allergies  Social History:  The patient  reports that he has never smoked. He does not have any  smokeless tobacco history on file. He reports that he does not drink alcohol or use illicit drugs.   Family History:  The patient's family history is not on file.   ROS:  Please see the history of present illness.      All other systems reviewed and negative.   PHYSICAL EXAM: VS:  BP 110/78  Pulse 52  Ht 5\' 11"  (1.803 m)  Wt 161 lb (73.029 kg)  BMI 22.46 kg/m2 Well nourished, well developed, in no acute distress HEENT: normal Neck: no JVD Cardiac:  normal S1, S2; RRR; no murmur Lungs:  clear to auscultation bilaterally, no wheezing, rhonchi or rales Abd: soft, nontender, no hepatomegaly Ext: no edema Skin: warm and dry Neuro:  CNs 2-12 intact, no focal abnormalities noted  EKG:  Sinus bradycardia, LVH by voltage with early repolarization     ASSESSMENT AND PLAN:  1. ASCAD with no angina - continue ASA/Brilinta  2. HTN - controlled - continue lisinopril/metoprolol  - he is getting labs check at his PCP tomorrow 3. Dyslipidemia - LDL at goal - continue Crestor        4.  Asymptomatic bradycardia  Followup with me in 6 months  Signed, Armanda Magicraci Turner, MD 12/30/2013 11:02 AM

## 2013-12-30 NOTE — Patient Instructions (Signed)
Your physician recommends that you continue on your current medications as directed. Please refer to the Current Medication list given to you today.  Your physician wants you to follow-up in: 6 months with Dr Turner You will receive a reminder letter in the mail two months in advance. If you don't receive a letter, please call our office to schedule the follow-up appointment.  

## 2014-01-19 ENCOUNTER — Ambulatory Visit: Payer: 59 | Admitting: Cardiology

## 2014-01-25 ENCOUNTER — Other Ambulatory Visit: Payer: Self-pay

## 2014-01-25 MED ORDER — METOPROLOL TARTRATE 25 MG PO TABS: 25.0000 mg | ORAL_TABLET | Freq: Two times a day (BID) | ORAL | Status: DC

## 2014-01-25 MED ORDER — LISINOPRIL 20 MG PO TABS: 20.0000 mg | ORAL_TABLET | Freq: Every day | ORAL | Status: DC

## 2014-01-25 MED ORDER — ROSUVASTATIN CALCIUM 20 MG PO TABS: 20.0000 mg | ORAL_TABLET | Freq: Every day | ORAL | Status: DC

## 2014-01-25 MED ORDER — TICAGRELOR 90 MG PO TABS: 90.0000 mg | ORAL_TABLET | Freq: Two times a day (BID) | ORAL | Status: DC

## 2014-02-18 IMAGING — CR DG CHEST 2V
2 series · 2 of 2 positions shown · non-contrast
Comparison: 09/02/2009

CLINICAL DATA: Chest pressure, jaw pain

CHEST - 2 VIEW

[w chest pa]
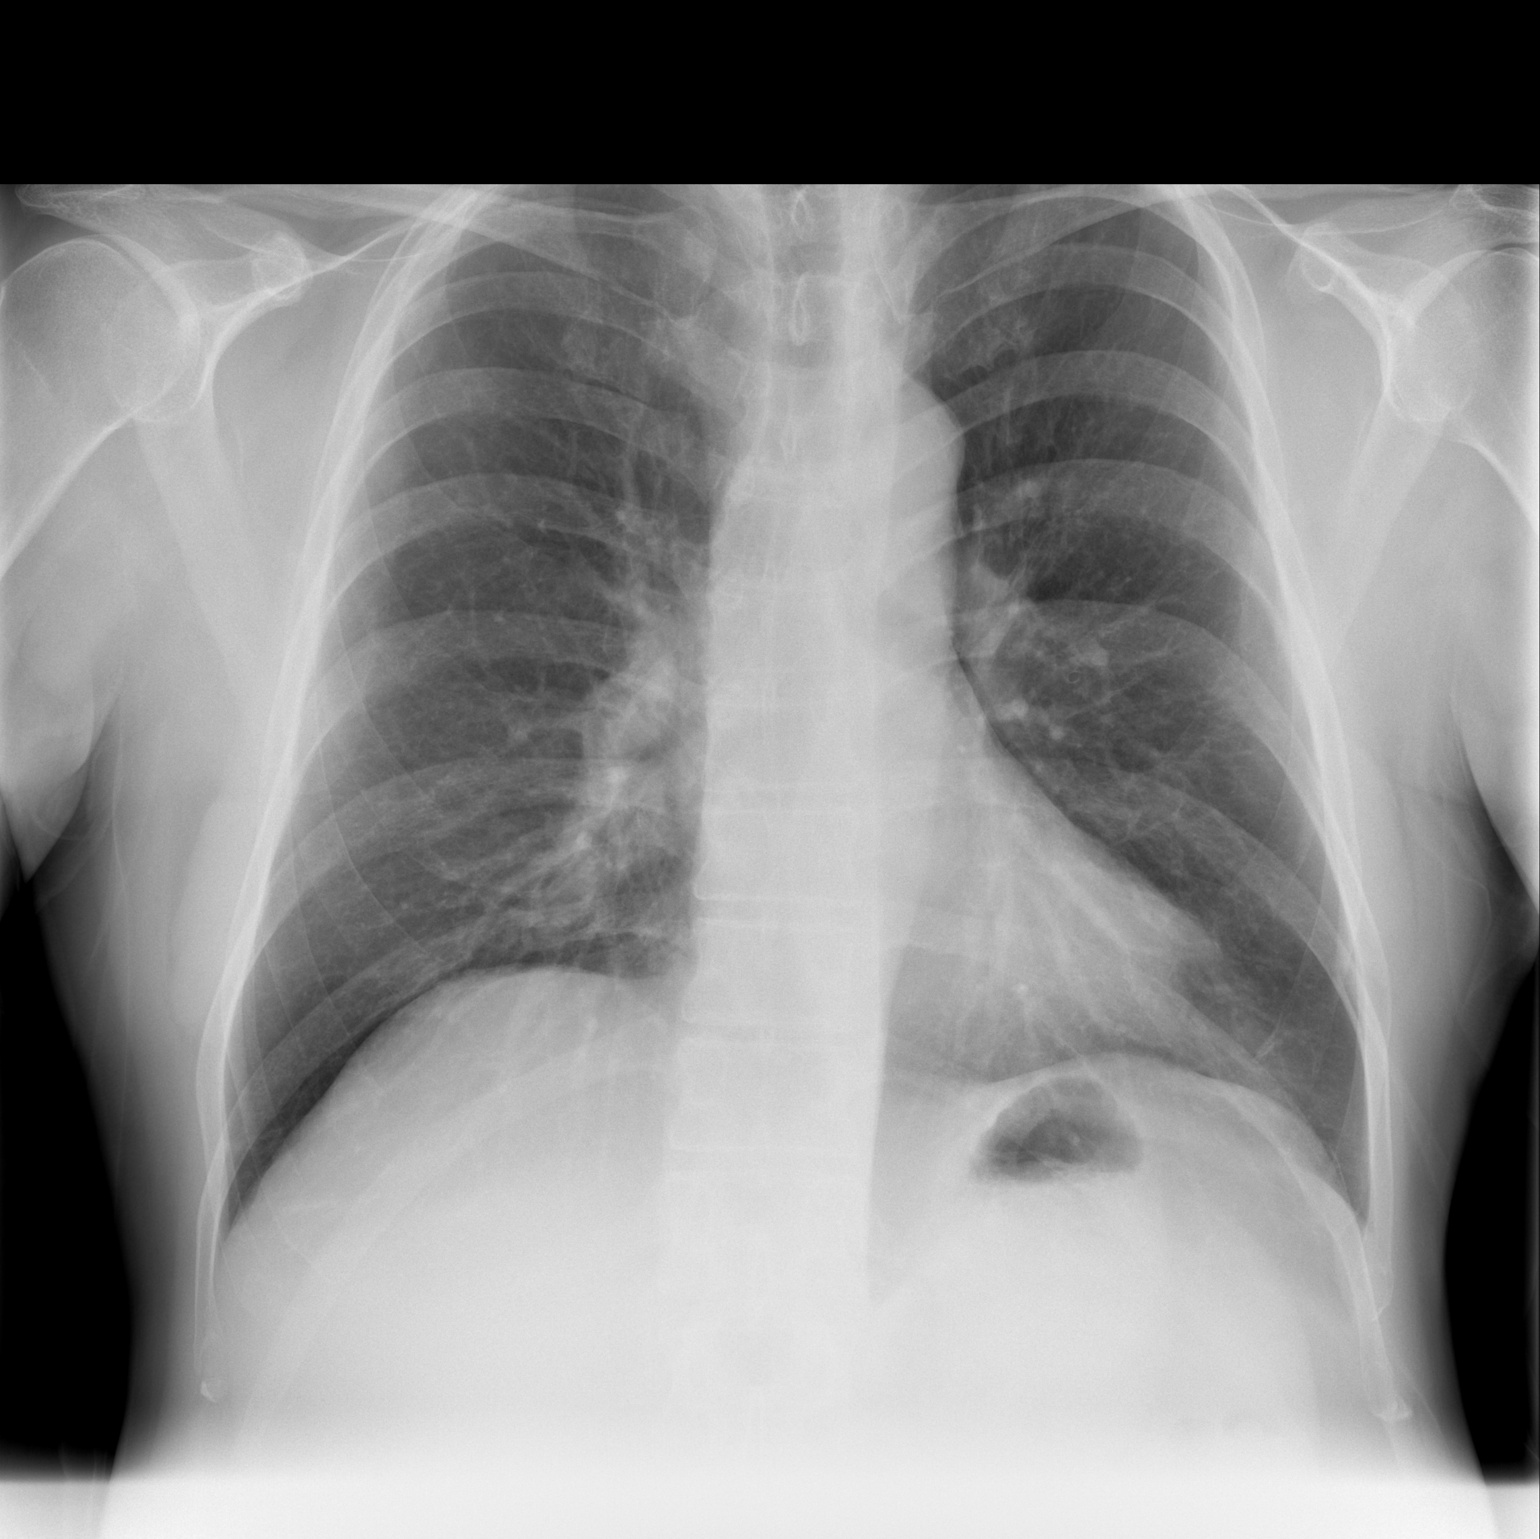

[w chest lat]
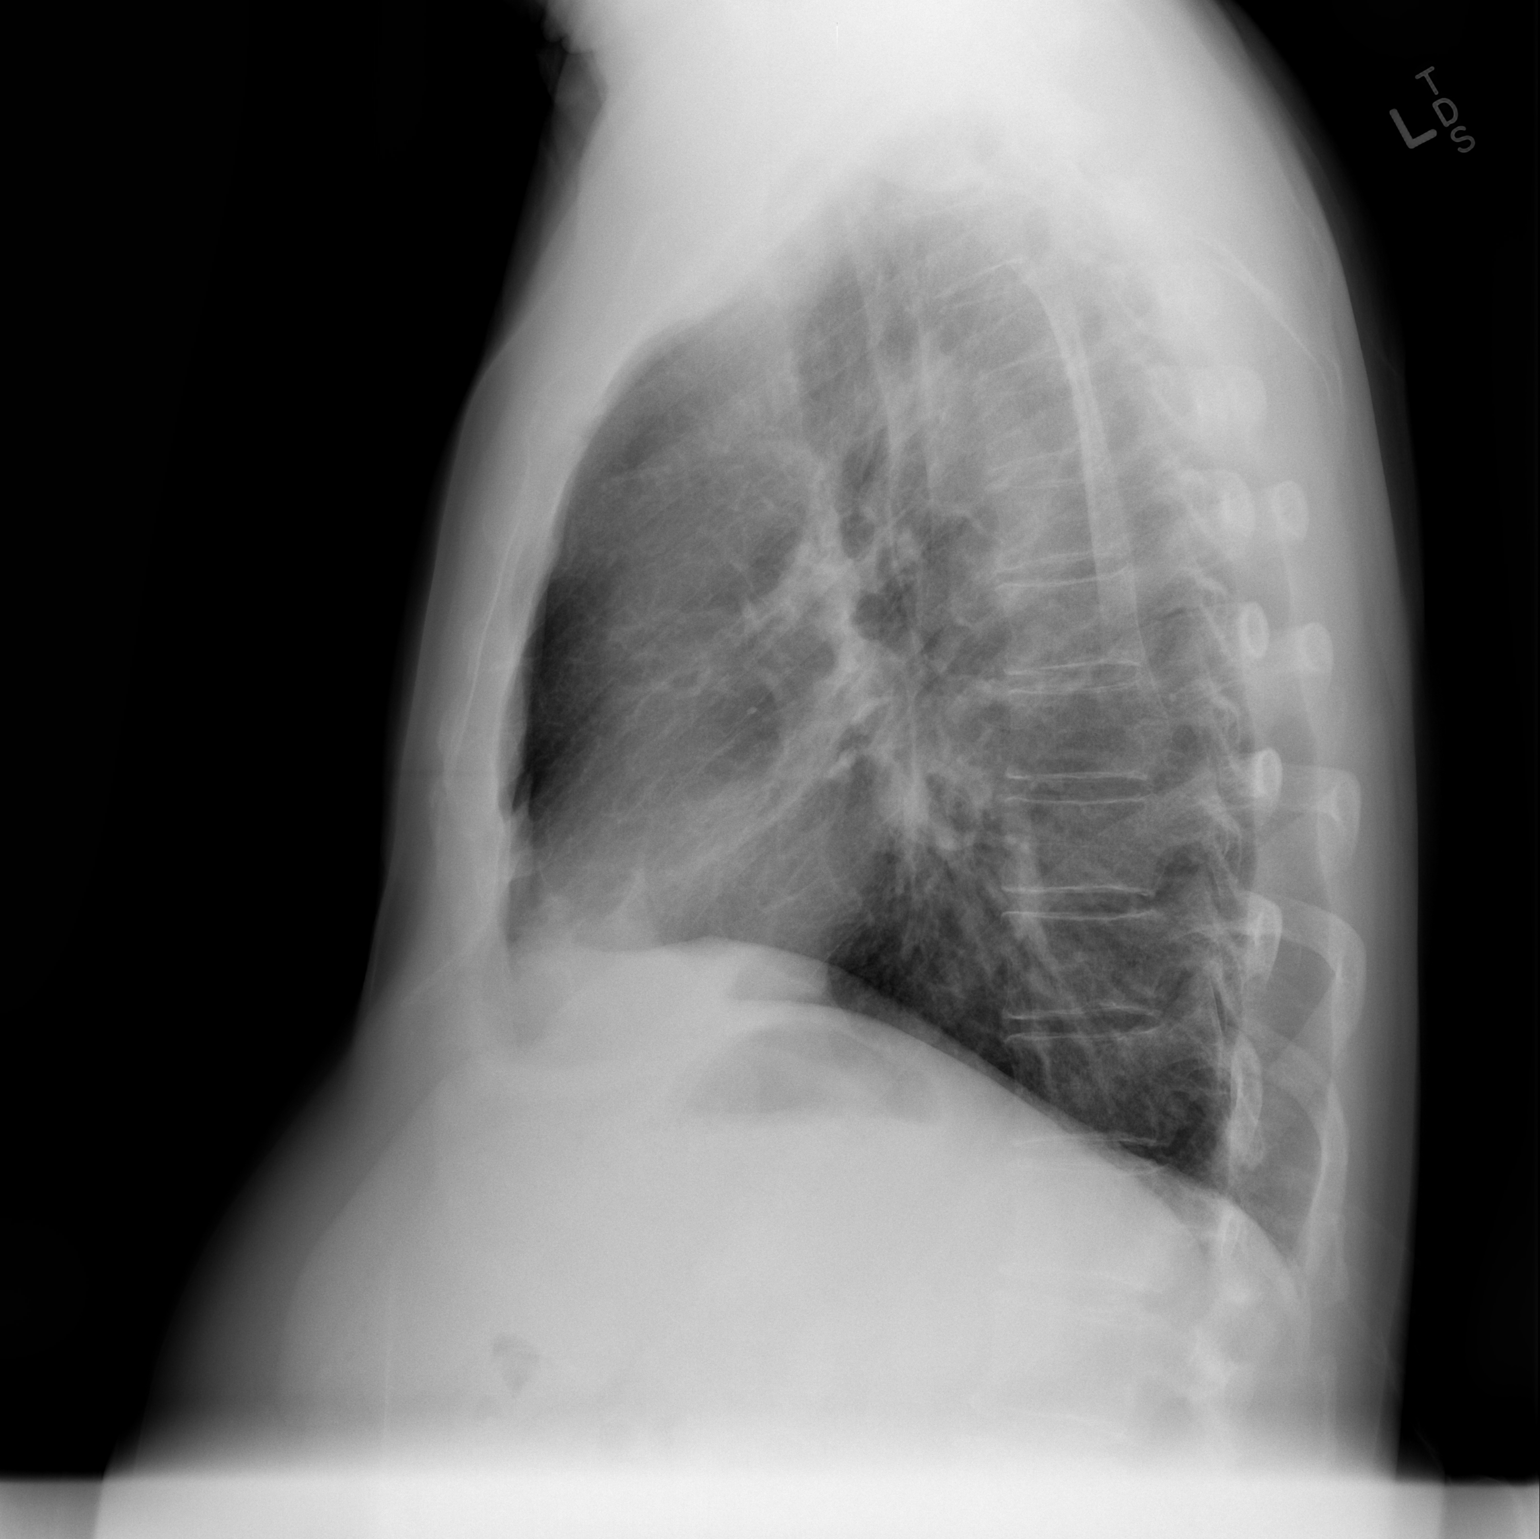

[2 of 2 positions shown; findings below may reference images not displayed]

FINDINGS: Normal heart size and vascularity.  Mild hyperinflation.
No focal pneumonia, collapse, consolidation, effusion or
pneumothorax.  Chronic apical scarring.  Stable exam.
IMPRESSION: Stable chest exam.  No superimposed acute process

## 2014-04-13 ENCOUNTER — Encounter: Payer: Self-pay | Admitting: Cardiology

## 2014-06-10 ENCOUNTER — Encounter (HOSPITAL_COMMUNITY): Payer: Self-pay | Admitting: Interventional Cardiology

## 2014-06-29 ENCOUNTER — Other Ambulatory Visit: Payer: Self-pay

## 2014-06-29 ENCOUNTER — Encounter: Payer: Self-pay | Admitting: Cardiology

## 2014-06-29 ENCOUNTER — Ambulatory Visit (INDEPENDENT_AMBULATORY_CARE_PROVIDER_SITE_OTHER): Payer: 59 | Admitting: Cardiology

## 2014-06-29 VITALS — BP 122/80 | HR 60 | Ht 71.0 in | Wt 175.0 lb

## 2014-06-29 DIAGNOSIS — I2583 Coronary atherosclerosis due to lipid rich plaque: Secondary | ICD-10-CM

## 2014-06-29 DIAGNOSIS — I2510 Atherosclerotic heart disease of native coronary artery without angina pectoris: Secondary | ICD-10-CM

## 2014-06-29 DIAGNOSIS — E785 Hyperlipidemia, unspecified: Secondary | ICD-10-CM

## 2014-06-29 DIAGNOSIS — I10 Essential (primary) hypertension: Secondary | ICD-10-CM

## 2014-06-29 LAB — BASIC METABOLIC PANEL
BUN: 15 mg/dL (ref 6–23)
CHLORIDE: 104 meq/L (ref 96–112)
CO2: 30 meq/L (ref 19–32)
Calcium: 9.7 mg/dL (ref 8.4–10.5)
Creatinine, Ser: 1 mg/dL (ref 0.4–1.5)
GFR: 81.45 mL/min (ref >60.00)
GLUCOSE: 138 mg/dL — AB (ref 70–99)
POTASSIUM: 4.7 meq/L (ref 3.5–5.1)
Sodium: 141 mEq/L (ref 135–145)

## 2014-06-29 LAB — LIPID PANEL
CHOL/HDL RATIO: 4
Cholesterol: 114 mg/dL (ref 0–200)
HDL: 32.4 mg/dL — AB (ref >39.00)
LDL Cholesterol: 47 mg/dL (ref 0–99)
NONHDL: 81.6
Triglycerides: 172 mg/dL — ABNORMAL HIGH (ref 0.0–149.0)
VLDL: 34.4 mg/dL (ref 0.0–40.0)

## 2014-06-29 LAB — ALT: ALT: 37 U/L (ref 0–53)

## 2014-06-29 MED ORDER — TICAGRELOR 90 MG PO TABS: 90.0000 mg | ORAL_TABLET | Freq: Two times a day (BID) | ORAL | Status: DC

## 2014-06-29 MED ORDER — ROSUVASTATIN CALCIUM 20 MG PO TABS: 20.0000 mg | ORAL_TABLET | Freq: Every day | ORAL | Status: DC

## 2014-06-29 MED ORDER — LISINOPRIL 20 MG PO TABS
20.0000 mg | ORAL_TABLET | Freq: Every day | ORAL | Status: DC
Start: 2014-06-29 — End: 2014-12-06

## 2014-06-29 MED ORDER — METOPROLOL TARTRATE 25 MG PO TABS: 25.0000 mg | ORAL_TABLET | Freq: Two times a day (BID) | ORAL | Status: DC

## 2014-06-29 NOTE — Progress Notes (Signed)
[address] [place], [place]  [address] Phone: [phone] Fax:  [phone]  Date:  06/29/2014   ID:  Samuel HamperJames F Allender, DOB Apr 10, 1956, MRN [id number]  PCP:  Elizabeth PalauANDERSON,TERESA, FNP  Cardiologist:  Armanda Magicraci Turner, MD    History of Present Illness: Samuel Wallace is a 58 y.o. male with a history fo ASCAD, HTN and dyslipidemia who presents today for followup. He is doing well. He denies any chest pain, SOB, DOE, LE edema, dizziness, palpitations or syncope. He has not been getting much aerobic exercise due to lack of time.    Wt Readings from Last 3 Encounters:  06/29/14 175 lb (79.379 kg)  12/30/13 161 lb (73.029 kg)  06/15/13 173 lb (78.472 kg)     Past Medical History  Diagnosis Date  . Diabetes mellitus without complication   . Hypertension   . CAD (coronary artery disease) 09/30/2012    with DES to Ramus and OM with + NSTEMI    Current Outpatient Prescriptions  Medication Sig Dispense Refill  . aspirin 81 MG chewable tablet Chew 1 tablet (81 mg total) by mouth daily.    Marland Kitchen. GLIPIZIDE XL 5 MG 24 hr tablet Take 5 mg by mouth daily.    Marland Kitchen. latanoprost (XALATAN) 0.005 % ophthalmic solution Place 1 drop into the right eye at bedtime.     Marland Kitchen. lisinopril (PRINIVIL,ZESTRIL) 20 MG tablet Take 1 tablet (20 mg total) by mouth daily. 30 tablet 6  . metFORMIN (GLUCOPHAGE) 500 MG tablet Take 1 tablet (500 mg total) by mouth 2 (two) times daily with a meal. Do not restart Metformin until 4/3 (Patient taking differently: Take 1,000 mg by mouth 2 (two) times daily with a meal. Do not restart Metformin until 4/3)    . metoprolol tartrate (LOPRESSOR) 25 MG tablet Take 1 tablet (25 mg total) by mouth 2 (two) times daily. 60 tablet 6  . nitroGLYCERIN (NITROSTAT) 0.4 MG SL tablet Place 0.4 mg under the tongue every 5 (five) minutes as needed.     . rosuvastatin (CRESTOR) 20 MG tablet Take 1 tablet (20 mg total) by mouth daily. 30 tablet 6  . ticagrelor (BRILINTA) 90 MG TABS tablet Take 1  tablet (90 mg total) by mouth 2 (two) times daily. 60 tablet 6   No current facility-administered medications for this visit.    Allergies:   No Known Allergies  Social History:  The patient  reports that he has never smoked. He does not have any smokeless tobacco history on file. He reports that he does not drink alcohol or use illicit drugs.   Family History:  The patient's family history is not on file.   ROS:  Please see the history of present illness.      All other systems reviewed and negative.   PHYSICAL EXAM: VS:  BP 122/80 mmHg  Pulse 60  Ht 5\' 11"  (1.803 m)  Wt 175 lb (79.379 kg)  BMI 24.42 kg/m2 Well nourished, well developed, in no acute distress HEENT: normal Neck: no JVD Cardiac:  normal S1, S2; RRR; no murmur Lungs:  clear to auscultation bilaterally, no wheezing, rhonchi or rales Abd: soft, nontender, no hepatomegaly Ext: no edema Skin: warm and dry Neuro:  CNs 2-12 intact, no focal abnormalities noted  ASSESSMENT AND PLAN:  1. ASCAD with no angina - continue ASA/Brilinta  2. HTN - controlled - continue lisinopril/metoprolol  - check BMET 3. Dyslipidemia - LDL at goal - continue Crestor  - check FLP  and ALT   Followup with me in 1 year   Signed, Armanda Magicraci Turner, MD [facility] 06/29/2014 8:55 AM

## 2014-06-29 NOTE — Patient Instructions (Signed)
Your physician wants you to follow-up in: 1 Year with Dr. Mayford Knifeurner. You will receive a reminder letter in the mail two months in advance. If you don't receive a letter, please call our office to schedule the follow-up appointment.  Your physician recommends that you return for FASTING lab work in: TODAY (Bmet/ALT/Lipid Panel)

## 2014-07-05 ENCOUNTER — Telehealth: Payer: Self-pay | Admitting: Cardiology

## 2014-07-05 DIAGNOSIS — E785 Hyperlipidemia, unspecified: Secondary | ICD-10-CM

## 2014-07-05 MED ORDER — OMEGA-3 FISH OIL 1000 MG PO CAPS: 4.0000 | ORAL_CAPSULE | Freq: Every day | ORAL | Status: DC

## 2014-07-05 NOTE — Telephone Encounter (Signed)
Returned patient's call about his lab results. Per Dr. Mayford Knife, LDL is at goal but triglycerides are elevated, start Omega 3 fish oil 4 grams daily, recheck labs in 3 months and follow a low fat diet. Patient was agreeable with this plan. Scheduled labs on September 28, 2014 at 7:30.

## 2014-07-05 NOTE — Telephone Encounter (Signed)
New Message ° ° ° ° ° ° °Pt calling to get lab results. Please call back and advise. °

## 2014-09-28 ENCOUNTER — Other Ambulatory Visit (INDEPENDENT_AMBULATORY_CARE_PROVIDER_SITE_OTHER): Payer: 59 | Admitting: *Deleted

## 2014-09-28 DIAGNOSIS — E785 Hyperlipidemia, unspecified: Secondary | ICD-10-CM

## 2014-09-28 LAB — BASIC METABOLIC PANEL
BUN: 14 mg/dL (ref 6–23)
CALCIUM: 9.7 mg/dL (ref 8.4–10.5)
CO2: 30 mEq/L (ref 19–32)
Chloride: 104 mEq/L (ref 96–112)
Creatinine, Ser: 1.05 mg/dL (ref 0.40–1.50)
GFR: 76.93 mL/min (ref >60.00)
Glucose, Bld: 110 mg/dL — ABNORMAL HIGH (ref 70–99)
POTASSIUM: 4.4 meq/L (ref 3.5–5.1)
Sodium: 140 mEq/L (ref 135–145)

## 2014-09-28 LAB — LIPID PANEL
Cholesterol: 102 mg/dL (ref 0–200)
HDL: 35 mg/dL — AB (ref >39.00)
LDL Cholesterol: 40 mg/dL (ref 0–99)
NonHDL: 67
TRIGLYCERIDES: 136 mg/dL (ref 0.0–149.0)
Total CHOL/HDL Ratio: 3
VLDL: 27.2 mg/dL (ref 0.0–40.0)

## 2014-12-06 ENCOUNTER — Other Ambulatory Visit: Payer: Self-pay | Admitting: Cardiology

## 2014-12-07 NOTE — Telephone Encounter (Signed)
Per note 12.12.15

## 2015-03-16 ENCOUNTER — Encounter: Payer: Self-pay | Admitting: Cardiology

## 2015-06-04 ENCOUNTER — Other Ambulatory Visit: Payer: Self-pay | Admitting: Cardiology

## 2015-06-22 ENCOUNTER — Encounter: Payer: Self-pay | Admitting: Cardiology

## 2015-07-06 ENCOUNTER — Encounter: Payer: Self-pay | Admitting: Cardiology

## 2015-07-06 ENCOUNTER — Ambulatory Visit (INDEPENDENT_AMBULATORY_CARE_PROVIDER_SITE_OTHER): Payer: 59 | Admitting: Cardiology

## 2015-07-06 VITALS — BP 142/80 | HR 66 | Ht 71.0 in | Wt 180.0 lb

## 2015-07-06 DIAGNOSIS — E785 Hyperlipidemia, unspecified: Secondary | ICD-10-CM | POA: Diagnosis not present

## 2015-07-06 DIAGNOSIS — I10 Essential (primary) hypertension: Secondary | ICD-10-CM | POA: Diagnosis not present

## 2015-07-06 DIAGNOSIS — I2510 Atherosclerotic heart disease of native coronary artery without angina pectoris: Secondary | ICD-10-CM | POA: Diagnosis not present

## 2015-07-06 DIAGNOSIS — I2583 Coronary atherosclerosis due to lipid rich plaque: Principal | ICD-10-CM

## 2015-07-06 MED ORDER — TICAGRELOR 60 MG PO TABS: 60.0000 mg | ORAL_TABLET | Freq: Two times a day (BID) | ORAL | Status: DC

## 2015-07-06 NOTE — Progress Notes (Addendum)
Cardiology Office Note   Date:  07/06/2015   ID:  Samuel HamperJames F Adamec, DOB 12-04-1955, MRN [id number]  PCP:  Elizabeth PalauANDERSON,TERESA, FNP    Chief Complaint  Patient presents with  . Coronary Artery Disease  . Hypertension      History of Present Illness: Samuel Wallace is a 60 y.o. male with a history fo ASCAD, HTN and dyslipidemia who presents today for followup. He is doing well. He denies any chest pain, SOB, DOE, LE edema, dizziness, palpitations or syncope. He has not been getting much aerobic exercise due to lack of time.     Past Medical History  Diagnosis Date  . Diabetes mellitus without complication (HCC)   . Hypertension   . CAD (coronary artery disease) 09/30/2012    with DES to Ramus and OM with + NSTEMI  . Hyperlipidemia     Past Surgical History  Procedure Laterality Date  . Eye surgery      ,detached retina repair..  . Left and right heart catheterization with coronary angiogram  09/29/2012    Procedure: LEFT AND RIGHT HEART CATHETERIZATION WITH CORONARY ANGIOGRAM;  Surgeon: Lesleigh NoeHenry W Smith III, MD;  [facility];  Service: Cardiovascular;;     Current Outpatient Prescriptions  Medication Sig Dispense Refill  . aspirin 81 MG chewable tablet Chew 1 tablet (81 mg total) by mouth daily.    Marland Kitchen. BRILINTA 90 MG TABS tablet Take 1 tablet by mouth two  times daily 180 tablet 0  . CRESTOR 20 MG tablet Take 1 tablet by mouth  daily 90 tablet 0  . GLIPIZIDE XL 5 MG 24 hr tablet Take 5 mg by mouth daily.    Marland Kitchen. latanoprost (XALATAN) 0.005 % ophthalmic solution Place 1 drop into the right eye at bedtime.     Marland Kitchen. lisinopril (PRINIVIL,ZESTRIL) 20 MG tablet Take 1 tablet by mouth  daily 90 tablet 0  . metFORMIN (GLUCOPHAGE) 500 MG tablet Take 1 tablet (500 mg total) by mouth 2 (two) times daily with a meal. Do not restart Metformin until 4/3 (Patient taking differently: Take 1,000 mg by mouth 2 (two) times daily with a meal. )    . metoprolol tartrate  (LOPRESSOR) 25 MG tablet Take 1 tablet by mouth two  times daily 180 tablet 0  . nitroGLYCERIN (NITROSTAT) 0.4 MG SL tablet Place 0.4 mg under the tongue every 5 (five) minutes as needed.     Marland Kitchen. omega-3 fish oil (MAXEPA) 1000 MG CAPS capsule Take 4 capsules (4,000 mg total) by mouth daily.     No current facility-administered medications for this visit.    Allergies:   Review of patient's allergies indicates no known allergies.    Social History:  The patient  reports that he has never smoked. He does not have any smokeless tobacco history on file. He reports that he does not drink alcohol or use illicit drugs.   Family History:  The patient's family history includes Diabetes in his mother; Hypertension in his mother.    ROS:  Please see the history of present illness.   Otherwise, review of systems are positive for none.   All other systems are reviewed and negative.    PHYSICAL EXAM: VS:  BP 142/80 mmHg  Pulse 66  Ht 5\' 11"  (1.803 m)  Wt 180 lb (81.647 kg)  BMI 25.12 kg/m2 , BMI Body mass index is 25.12 kg/(m^2). GEN:  Well nourished, well developed, in no acute distress HEENT: normal Neck: no JVD, carotid bruits, or masses Cardiac: RRR; no murmurs, rubs, or gallops,no edema  Respiratory:  clear to auscultation bilaterally, normal work of breathing GI: soft, nontender, nondistended, + BS MS: no deformity or atrophy Skin: warm and dry, no rash Neuro:  Strength and sensation are intact Psych: euthymic mood, full affect   EKG:  EKG is ordered today. The ekg ordered today demonstrates NSR with HR 66bpm and early repolarization   Recent Labs: 09/28/2014: BUN 14; Creatinine, Ser 1.05; Potassium 4.4; Sodium 140    Lipid Panel    Component Value Date/Time   CHOL 102 09/28/2014 0745   TRIG 136.0 09/28/2014 0745   HDL 35.00* 09/28/2014 0745   CHOLHDL 3 09/28/2014 0745   VLDL 27.2 09/28/2014 0745   LDLCALC 40 09/28/2014 0745      Wt Readings from Last 3 Encounters:    07/06/15 180 lb (81.647 kg)  06/29/14 175 lb (79.379 kg)  12/30/13 161 lb (73.029 kg)      ASSESSMENT AND PLAN:  1. ASCAD with no angina - continue ASA/Brilinta  - decrease Brilinta to 60mg  BID - continue BB 2. HTN - controlled - continue lisinopril/metoprolol  - check BMET 3. Dyslipidemia - LDL at goal - continue Crestor  - check FLP and ALT   Current medicines are reviewed at length with the patient today.  The patient does not have concerns regarding medicines.  The following changes have been made:  Decrease Brilinta to 60mg  BID  Labs/ tests ordered today: See above Assessment and Plan No orders of the defined types were placed in this encounter.     Disposition:   FU with me in 1 year  Signed, Quintella Reichert, MD  07/06/2015 8:40 AM    [facility] [address] [place], [place], [place]  [address] Phone: [phone]; Fax: [phone]

## 2015-07-06 NOTE — Patient Instructions (Addendum)
Medication Instructions:  Your physician has recommended you make the following change in your medication:  1) DECREASE BRILINTA to 60 mg TWICE DAILY  Labwork: Your physician recommends that you return for FASTING lab work on July 20, 2015. You may come ANYTIME between 7:30 AM and 5:00 PM.  Testing/Procedures: None  Follow-Up: Your physician wants you to follow-up in: 1 year with Dr. Mayford Knifeurner. You will receive a reminder letter in the mail two months in advance. If you don't receive a letter, please call our office to schedule the follow-up appointment.   Any Other Special Instructions Will Be Listed Below (If Applicable).     If you need a refill on your cardiac medications before your next appointment, please call your pharmacy.

## 2015-07-06 NOTE — Addendum Note (Signed)
Addended by: Armanda MagicURNER, TRACI R on: 07/06/2015 10:10 AM   Modules accepted: SmartSet

## 2015-07-20 ENCOUNTER — Other Ambulatory Visit (INDEPENDENT_AMBULATORY_CARE_PROVIDER_SITE_OTHER): Payer: 59 | Admitting: *Deleted

## 2015-07-20 DIAGNOSIS — I2583 Coronary atherosclerosis due to lipid rich plaque: Principal | ICD-10-CM

## 2015-07-20 DIAGNOSIS — I2510 Atherosclerotic heart disease of native coronary artery without angina pectoris: Secondary | ICD-10-CM

## 2015-07-20 DIAGNOSIS — E785 Hyperlipidemia, unspecified: Secondary | ICD-10-CM

## 2015-07-20 DIAGNOSIS — I10 Essential (primary) hypertension: Secondary | ICD-10-CM

## 2015-07-20 LAB — BASIC METABOLIC PANEL
BUN: 12 mg/dL (ref 7–25)
CO2: 26 mmol/L (ref 20–31)
Calcium: 9.8 mg/dL (ref 8.6–10.3)
Chloride: 103 mmol/L (ref 98–110)
Creat: 1.07 mg/dL (ref 0.70–1.33)
GLUCOSE: 162 mg/dL — AB (ref 65–99)
Potassium: 4.5 mmol/L (ref 3.5–5.3)
Sodium: 138 mmol/L (ref 135–146)

## 2015-07-20 LAB — LIPID PANEL
CHOL/HDL RATIO: 2.9 ratio (ref <=5.0)
CHOLESTEROL: 96 mg/dL — AB (ref 125–200)
HDL: 33 mg/dL — ABNORMAL LOW (ref >=40)
LDL Cholesterol: 39 mg/dL (ref <130)
Triglycerides: 122 mg/dL (ref <150)
VLDL: 24 mg/dL (ref <30)

## 2015-07-20 LAB — HEPATIC FUNCTION PANEL
ALBUMIN: 4.5 g/dL (ref 3.6–5.1)
ALT: 43 U/L (ref 9–46)
AST: 28 U/L (ref 10–35)
Alkaline Phosphatase: 46 U/L (ref 40–115)
BILIRUBIN TOTAL: 0.6 mg/dL (ref 0.2–1.2)
Bilirubin, Direct: 0.1 mg/dL (ref <=0.2)
Indirect Bilirubin: 0.5 mg/dL (ref 0.2–1.2)
Total Protein: 7.2 g/dL (ref 6.1–8.1)

## 2015-08-25 ENCOUNTER — Other Ambulatory Visit: Payer: Self-pay | Admitting: Cardiology

## 2016-07-09 ENCOUNTER — Encounter: Payer: Self-pay | Admitting: *Deleted

## 2016-07-24 ENCOUNTER — Ambulatory Visit (INDEPENDENT_AMBULATORY_CARE_PROVIDER_SITE_OTHER): Payer: 59 | Admitting: Cardiology

## 2016-07-24 ENCOUNTER — Encounter (INDEPENDENT_AMBULATORY_CARE_PROVIDER_SITE_OTHER): Payer: Self-pay

## 2016-07-24 ENCOUNTER — Encounter: Payer: Self-pay | Admitting: Cardiology

## 2016-07-24 VITALS — BP 120/78 | HR 60 | Ht 71.0 in | Wt 176.8 lb

## 2016-07-24 DIAGNOSIS — E7800 Pure hypercholesterolemia, unspecified: Secondary | ICD-10-CM

## 2016-07-24 DIAGNOSIS — I10 Essential (primary) hypertension: Secondary | ICD-10-CM

## 2016-07-24 DIAGNOSIS — I2510 Atherosclerotic heart disease of native coronary artery without angina pectoris: Secondary | ICD-10-CM | POA: Diagnosis not present

## 2016-07-24 NOTE — Progress Notes (Signed)
Cardiology Office Note    Date:  07/24/2016   ID:  [name], DOB [birth year], MRN [id number]  PCP:  Elizabeth Palau, FNP  Cardiologist:  Armanda Magic, MD   Chief Complaint  Patient presents with  . Coronary Artery Disease  . Hypertension  . Hyperlipidemia    History of Present Illness:  Samuel Wallace is a 61 y.o. male with a history fo ASCAD, HTN and dyslipidemia who presents today for followup. He is doing well. He denies any chest pain, SOB, DOE, LE edema, dizziness, palpitations or syncope. He denies any claudication.  He walks at work for exercise with over 10,000 steps on most days.     Past Medical History:  Diagnosis Date  . CAD (coronary artery disease) 09/30/2012   with DES to Ramus and OM with + NSTEMI  . Diabetes mellitus without complication (HCC)   . Hyperlipidemia   . Hypertension     Past Surgical History:  Procedure Laterality Date  . EYE SURGERY     ,detached retina repair..  . LEFT AND RIGHT HEART CATHETERIZATION WITH CORONARY ANGIOGRAM  09/29/2012   Procedure: LEFT AND RIGHT HEART CATHETERIZATION WITH CORONARY ANGIOGRAM;  Surgeon: Lesleigh Noe, MD;  Location: [facility] CATH LAB;  Service: Cardiovascular;;    Current Medications: Outpatient Medications Prior to Visit  Medication Sig Dispense Refill  . aspirin 81 MG chewable tablet Chew 1 tablet (81 mg total) by mouth daily.    Marland Kitchen GLIPIZIDE XL 5 MG 24 hr tablet Take 5 mg by mouth daily.    Marland Kitchen latanoprost (XALATAN) 0.005 % ophthalmic solution Place 1 drop into the right eye at bedtime.     Marland Kitchen lisinopril (PRINIVIL,ZESTRIL) 20 MG tablet Take 1 tablet by mouth  daily 90 tablet 3  . metFORMIN (GLUCOPHAGE) 500 MG tablet Take 1 tablet (500 mg total) by mouth 2 (two) times daily with a meal. Do not restart Metformin until 4/3 (Patient taking differently: Take 1,000 mg by mouth 2 (two) times daily with a meal. )    . metoprolol tartrate (LOPRESSOR) 25 MG tablet Take 1 tablet by mouth two  times daily 180  tablet 3  . nitroGLYCERIN (NITROSTAT) 0.4 MG SL tablet Place 0.4 mg under the tongue every 5 (five) minutes as needed.     Marland Kitchen omega-3 fish oil (MAXEPA) 1000 MG CAPS capsule Take 4 capsules (4,000 mg total) by mouth daily.    . rosuvastatin (CRESTOR) 20 MG tablet Take 1 tablet by mouth  daily 90 tablet 3  . ticagrelor (BRILINTA) 60 MG TABS tablet Take 1 tablet (60 mg total) by mouth 2 (two) times daily. 180 tablet 3   No facility-administered medications prior to visit.      Allergies:   Patient has no known allergies.   Social History   Social History  . Marital status: Married    Spouse name: N/A  . Number of children: N/A  . Years of education: N/A   Social History Main Topics  . Smoking status: Never Smoker  . Smokeless tobacco: Never Used  . Alcohol use No  . Drug use: No  . Sexual activity: Not Asked   Other Topics Concern  . None   Social History Narrative  . None     Family History:  The patient's family history includes Diabetes in his mother; Hypertension in his mother.   ROS:   Please see the history of present illness.    ROS All other systems reviewed and are  negative.  No flowsheet data found.     PHYSICAL EXAM:   VS:  BP 120/78   Pulse 60   Ht 5\' 11"  (1.803 m)   Wt 176 lb 12.8 oz (80.2 kg)   BMI 24.66 kg/m    GEN: Well nourished, well developed, in no acute distress  HEENT: normal  Neck: no JVD, carotid bruits, or masses Cardiac: RRR; no murmurs, rubs, or gallops,no edema.  Intact distal pulses bilaterally.  Respiratory:  clear to auscultation bilaterally, normal work of breathing GI: soft, nontender, nondistended, + BS MS: no deformity or atrophy  Skin: warm and dry, no rash Neuro:  Alert and Oriented x 3, Strength and sensation are intact Psych: euthymic mood, full affect  Wt Readings from Last 3 Encounters:  07/24/16 176 lb 12.8 oz (80.2 kg)  07/06/15 180 lb (81.6 kg)  06/29/14 175 lb (79.4 kg)      Studies/Labs Reviewed:   EKG:   EKG is not ordered today.    Recent Labs: No results found for requested labs within last 8760 hours.   Lipid Panel    Component Value Date/Time   CHOL 96 (L) 07/20/2015 0757   TRIG 122 07/20/2015 0757   HDL 33 (L) 07/20/2015 0757   CHOLHDL 2.9 07/20/2015 0757   VLDL 24 07/20/2015 0757   LDLCALC 39 07/20/2015 0757    Additional studies/ records that were reviewed today include:  none    ASSESSMENT:    1. Coronary artery disease involving native coronary artery of native heart without angina pectoris   2. Essential hypertension   3. Pure hypercholesterolemia      PLAN:  In order of problems listed above:  1. ASCAD with remote NSTEMI with PCI of the ramus and OM.  He has no anginal symptoms.  He will continue on ASA/BB/Brilinta and statin.  2. HTN - BP controlled on current meds.  Continue BB and ACE I.  Check BMET. 3. Hyperlipidemia with LDL goal < 70. Continue on statin.  LDL at 39 on 07/20/2015.  Check FLP and ALT today.    Medication Adjustments/Labs and Tests Ordered: Current medicines are reviewed at length with the patient today.  Concerns regarding medicines are outlined above.  Medication changes, Labs and Tests ordered today are listed in the Patient Instructions below.  There are no Patient Instructions on file for this visit.   Signed, Armanda Magicraci Turner, MD  07/24/2016 8:30 AM    [facility] [address], [place], [place]  [address] Phone: [phone]) [phone]; Fax: [phone]

## 2016-07-24 NOTE — Patient Instructions (Signed)

## 2016-07-25 LAB — HEPATIC FUNCTION PANEL
ALBUMIN: 4.6 g/dL (ref 3.6–4.8)
ALT: 34 IU/L (ref 0–44)
AST: 27 IU/L (ref 0–40)
Alkaline Phosphatase: 55 IU/L (ref 39–117)
Bilirubin Total: 0.6 mg/dL (ref 0.0–1.2)
Bilirubin, Direct: 0.19 mg/dL (ref 0.00–0.40)
TOTAL PROTEIN: 6.9 g/dL (ref 6.0–8.5)

## 2016-07-25 LAB — BASIC METABOLIC PANEL
BUN / CREAT RATIO: 15 (ref 10–24)
BUN: 17 mg/dL (ref 8–27)
CHLORIDE: 101 mmol/L (ref 96–106)
CO2: 25 mmol/L (ref 18–29)
CREATININE: 1.13 mg/dL (ref 0.76–1.27)
Calcium: 9.5 mg/dL (ref 8.6–10.2)
GFR calc non Af Amer: 70 mL/min/{1.73_m2} (ref >59)
GFR, EST AFRICAN AMERICAN: 81 mL/min/{1.73_m2} (ref >59)
Glucose: 148 mg/dL — ABNORMAL HIGH (ref 65–99)
Potassium: 4.3 mmol/L (ref 3.5–5.2)
Sodium: 142 mmol/L (ref 134–144)

## 2016-07-25 LAB — LIPID PANEL
Chol/HDL Ratio: 3.2 ratio units (ref 0.0–5.0)
Cholesterol, Total: 107 mg/dL (ref 100–199)
HDL: 33 mg/dL — ABNORMAL LOW (ref >39)
LDL CALC: 41 mg/dL (ref 0–99)
Triglycerides: 165 mg/dL — ABNORMAL HIGH (ref 0–149)
VLDL CHOLESTEROL CAL: 33 mg/dL (ref 5–40)

## 2016-09-05 ENCOUNTER — Other Ambulatory Visit: Payer: Self-pay | Admitting: Cardiology

## 2016-09-27 ENCOUNTER — Other Ambulatory Visit: Payer: Self-pay | Admitting: Cardiology

## 2017-05-14 ENCOUNTER — Other Ambulatory Visit: Payer: Self-pay | Admitting: Cardiology

## 2017-06-05 ENCOUNTER — Other Ambulatory Visit: Payer: Self-pay | Admitting: Cardiology

## 2017-07-23 NOTE — Progress Notes (Signed)
Cardiology Office Note:    Date:  07/24/2017   ID:  Samuel HamperJames F Kroboth, DOB [birth year], MRN [id number]  PCP:  Elizabeth PalauAnderson, Teresa, FNP  Cardiologist:  Armanda Magicraci Jettie Lazare, MD    Referring MD: Elizabeth PalauAnderson, Teresa, FNP   Chief Complaint  Patient presents with  . Coronary Artery Disease  . Hypertension  . Hyperlipidemia    History of Present Illness:    Samuel Wallace is a 62 y.o. male with a hx of ASCAD s/p NSTEMI with DES to the ramus and OM in 2014, HTN and dyslipidemia.  He is here today for followup and is doing well.  He denies any chest pain or pressure, SOB, DOE, PND, orthopnea, LE edema, dizziness, palpitations or syncope. He is compliant with his meds and is tolerating meds with no SE.  He does walk for exercise.     Past Medical History:  Diagnosis Date  . CAD (coronary artery disease) 09/30/2012   with DES to Ramus and OM with + NSTEMI  . Diabetes mellitus without complication (HCC)   . Hyperlipidemia   . Hypertension     Past Surgical History:  Procedure Laterality Date  . EYE SURGERY     ,detached retina repair..  . LEFT AND RIGHT HEART CATHETERIZATION WITH CORONARY ANGIOGRAM  09/29/2012   Procedure: LEFT AND RIGHT HEART CATHETERIZATION WITH CORONARY ANGIOGRAM;  Surgeon: Lesleigh NoeHenry W Smith III, MD;  [facility];  Service: Cardiovascular;;    Current Medications: Current Meds  Medication Sig  . aspirin 81 MG chewable tablet Chew 1 tablet (81 mg total) by mouth daily.  Marland Kitchen. BRILINTA 60 MG TABS tablet TAKE 1 TABLET BY MOUTH TWO  TIMES DAILY  . GLIPIZIDE XL 5 MG 24 hr tablet Take 5 mg by mouth daily.  Marland Kitchen. latanoprost (XALATAN) 0.005 % ophthalmic solution Place 1 drop into the right eye at bedtime.   Marland Kitchen. lisinopril (PRINIVIL,ZESTRIL) 20 MG tablet Take 1 tablet (20 mg total) by mouth daily. Please keep upcoming appt with Dr. Mayford Knifeurner in January. Thank you  . metFORMIN (GLUCOPHAGE) 500 MG tablet Take 500 mg by mouth 2 (two) times daily with a meal.  . metoprolol tartrate (LOPRESSOR)  25 MG tablet Take 1 tablet (25 mg total) by mouth 2 (two) times daily. Please keep upcoming appt with Dr. Mayford Knifeurner in January. Thank you  . nitroGLYCERIN (NITROSTAT) 0.4 MG SL tablet Place 0.4 mg under the tongue every 5 (five) minutes as needed.   Marland Kitchen. omega-3 fish oil (MAXEPA) 1000 MG CAPS capsule Take 4 capsules (4,000 mg total) by mouth daily.  . rosuvastatin (CRESTOR) 20 MG tablet Take 1 tablet (20 mg total) by mouth daily. Please keep upcoming appt with Dr. Mayford Knifeurner for January. Thank you     Allergies:   Patient has no known allergies.   Social History   Socioeconomic History  . Marital status: Married    Spouse name: None  . Number of children: None  . Years of education: None  . Highest education level: None  Social Needs  . Financial resource strain: None  . Food insecurity - worry: None  . Food insecurity - inability: None  . Transportation needs - medical: None  . Transportation needs - non-medical: None  Occupational History  . None  Tobacco Use  . Smoking status: Never Smoker  . Smokeless tobacco: Never Used  Substance and Sexual Activity  . Alcohol use: No  . Drug use: No  . Sexual activity: None  Other Topics Concern  .  None  Social History Narrative  . None     Family History: The patient's family history includes Diabetes in his mother; Hypertension in his mother.  ROS:   Please see the history of present illness.    ROS  All other systems reviewed and negative.   EKGs/Labs/Other Studies Reviewed:    The following studies were reviewed today: none  EKG:  EKG is  ordered today.  The ekg ordered today demonstrates sinus bradycardia at 49bpm with no ST changes  Recent Labs: 07/24/2016: ALT 34; BUN 17; Creatinine, Ser 1.13; Potassium 4.3; Sodium 142   Recent Lipid Panel    Component Value Date/Time   CHOL 107 07/24/2016 0848   TRIG 165 (H) 07/24/2016 0848   HDL 33 (L) 07/24/2016 0848   CHOLHDL 3.2 07/24/2016 0848   CHOLHDL 2.9 07/20/2015 0757    VLDL 24 07/20/2015 0757   LDLCALC 41 07/24/2016 0848    Physical Exam:    VS:  BP 132/80   Pulse (!) 49   Ht 5\' 11"  (1.803 m)   Wt 172 lb 3.2 oz (78.1 kg)   SpO2 99%   BMI 24.02 kg/m     Wt Readings from Last 3 Encounters:  07/24/17 172 lb 3.2 oz (78.1 kg)  07/24/16 176 lb 12.8 oz (80.2 kg)  07/06/15 180 lb (81.6 kg)     GEN:  Well nourished, well developed in no acute distress HEENT: Normal NECK: No JVD; No carotid bruits LYMPHATICS: No lymphadenopathy CARDIAC: RRR, no murmurs, rubs, gallops RESPIRATORY:  Clear to auscultation without rales, wheezing or rhonchi  ABDOMEN: Soft, non-tender, non-distended MUSCULOSKELETAL:  No edema; No deformity  SKIN: Warm and dry NEUROLOGIC:  Alert and oriented x 3 PSYCHIATRIC:  Normal affect   ASSESSMENT:    1. Coronary artery disease involving native coronary artery of native heart without angina pectoris   2. Essential hypertension   3. Pure hypercholesterolemia    PLAN:    In order of problems listed above:  1.  ASCAD - s/p NSTEMI with DES to the ramus and OM.  He has no anginal symptoms on exam.  He will continue brilinta 60mg  BID, ASA 81mg  daily, statin and BB..   2.  HTN - BP is well controlled on exam today.  He will continue on lisinopril 20mg  daily. I will decrease his Lopressor to 12.5mg  BID as HR is slow and he is a little fatigued. I will repeat a BMET.  3.  Hyperlipidemia with LDL goal < 70.  He will continue on Crestor 20mg  daily. I will get an FLP and ALT.   Medication Adjustments/Labs and Tests Ordered: Current medicines are reviewed at length with the patient today.  Concerns regarding medicines are outlined above.  Orders Placed This Encounter  Procedures  . EKG 12-Lead   No orders of the defined types were placed in this encounter.   Signed, Armanda Magic, MD  07/24/2017 8:26 AM    [facility]

## 2017-07-24 ENCOUNTER — Encounter: Payer: Self-pay | Admitting: Cardiology

## 2017-07-24 ENCOUNTER — Ambulatory Visit: Payer: 59 | Admitting: Cardiology

## 2017-07-24 VITALS — BP 132/80 | HR 49 | Ht 71.0 in | Wt 172.2 lb

## 2017-07-24 DIAGNOSIS — I2510 Atherosclerotic heart disease of native coronary artery without angina pectoris: Secondary | ICD-10-CM | POA: Diagnosis not present

## 2017-07-24 DIAGNOSIS — E7800 Pure hypercholesterolemia, unspecified: Secondary | ICD-10-CM

## 2017-07-24 DIAGNOSIS — I10 Essential (primary) hypertension: Secondary | ICD-10-CM

## 2017-07-24 LAB — BASIC METABOLIC PANEL
BUN/Creatinine Ratio: 12 (ref 10–24)
BUN: 11 mg/dL (ref 8–27)
CALCIUM: 10 mg/dL (ref 8.6–10.2)
CHLORIDE: 101 mmol/L (ref 96–106)
CO2: 25 mmol/L (ref 20–29)
CREATININE: 0.94 mg/dL (ref 0.76–1.27)
GFR calc non Af Amer: 87 mL/min/{1.73_m2} (ref >59)
GFR, EST AFRICAN AMERICAN: 101 mL/min/{1.73_m2} (ref >59)
Glucose: 148 mg/dL — ABNORMAL HIGH (ref 65–99)
Potassium: 5.1 mmol/L (ref 3.5–5.2)
Sodium: 140 mmol/L (ref 134–144)

## 2017-07-24 LAB — HEPATIC FUNCTION PANEL
ALBUMIN: 4.9 g/dL — AB (ref 3.6–4.8)
ALT: 29 IU/L (ref 0–44)
AST: 22 IU/L (ref 0–40)
Alkaline Phosphatase: 58 IU/L (ref 39–117)
BILIRUBIN TOTAL: 0.6 mg/dL (ref 0.0–1.2)
BILIRUBIN, DIRECT: 0.2 mg/dL (ref 0.00–0.40)
TOTAL PROTEIN: 7.1 g/dL (ref 6.0–8.5)

## 2017-07-24 LAB — LIPID PANEL
CHOL/HDL RATIO: 2.6 ratio (ref 0.0–5.0)
Cholesterol, Total: 98 mg/dL — ABNORMAL LOW (ref 100–199)
HDL: 37 mg/dL — ABNORMAL LOW (ref >39)
LDL CALC: 39 mg/dL (ref 0–99)
Triglycerides: 112 mg/dL (ref 0–149)
VLDL CHOLESTEROL CAL: 22 mg/dL (ref 5–40)

## 2017-07-24 MED ORDER — METOPROLOL TARTRATE 25 MG PO TABS: 12.5000 mg | ORAL_TABLET | Freq: Two times a day (BID) | ORAL | 3 refills | Status: DC

## 2017-07-24 NOTE — Patient Instructions (Signed)
Medication Instructions:  Your physician has recommended you make the following change in your medication:  DECREASE: Lopressor to 12.5 mg two times a day   Labwork: Today for kidney function, liver function, and fasting lipids  Testing/Procedures: None ordered   Follow-Up: Your physician wants you to follow-up in: 1 year with Dr. Mayford Knife. You will receive a reminder letter in the mail two months in advance. If you don't receive a letter, please call our office to schedule the follow-up appointment.  Any Other Special Instructions Will Be Listed Below (If Applicable).  Your physician would like for you to check you blood pressure daily for 1 week and call the office at [phone] with results.    How to Take Your Blood Pressure You can take your blood pressure at home with a machine. You may need to check your blood pressure at home:  To check if you have high blood pressure (hypertension).  To check your blood pressure over time.  To make sure your blood pressure medicine is working.  Supplies needed: You will need a blood pressure machine, or monitor. You can buy one at a drugstore or online. When choosing one:  Choose one with an arm cuff.  Choose one that wraps around your upper arm. Only one finger should fit between your arm and the cuff.  Do not choose one that measures your blood pressure from your wrist or finger.  Your doctor can suggest a monitor. How to prepare Avoid these things for 30 minutes before checking your blood pressure:  Drinking caffeine.  Drinking alcohol.  Eating.  Smoking.  Exercising.  Five minutes before checking your blood pressure:  Pee.  Sit in a dining chair. Avoid sitting in a soft couch or armchair.  Be quiet. Do not talk.  How to take your blood pressure Follow the instructions that came with your machine. If you have a digital blood pressure monitor, these may be the instructions: 1. Sit up straight. 2. Place your  feet on the floor. Do not cross your ankles or legs. 3. Rest your left arm at the level of your heart. You may rest it on a table, desk, or chair. 4. Pull up your shirt sleeve. 5. Wrap the blood pressure cuff around the upper part of your left arm. The cuff should be 1 inch (2.5 cm) above your elbow. It is best to wrap the cuff around bare skin. 6. Fit the cuff snugly around your arm. You should be able to place only one finger between the cuff and your arm. 7. Put the cord inside the groove of your elbow. 8. Press the power button. 9. Sit quietly while the cuff fills with air and loses air. 10. Write down the numbers on the screen. 11. Wait 2-3 minutes and then repeat steps 1-10.  What do the numbers mean? Two numbers make up your blood pressure. The first number is called systolic pressure. The second is called diastolic pressure. An example of a blood pressure reading is "120 over 80" (or 120/80). If you are an adult and do not have a medical condition, use this guide to find out if your blood pressure is normal: Normal  First number: below 120.  Second number: below 80. Elevated  First number: 120-129.  Second number: below 80. Hypertension stage 1  First number: 130-139.  Second number: 80-89. Hypertension stage 2  First number: 140 or above.  Second number: 90 or above. Your blood pressure is above normal even if  only the top or bottom number is above normal. Follow these instructions at home:  Check your blood pressure as often as your doctor tells you to.  Take your monitor to your next doctor's appointment. Your doctor will: ? Make sure you are using it correctly. ? Make sure it is working right.  Make sure you understand what your blood pressure numbers should be.  Tell your doctor if your medicines are causing side effects. Contact a doctor if:  Your blood pressure keeps being high. Get help right away if:  Your first blood pressure number is higher than  180.  Your second blood pressure number is higher than 120. This information is not intended to replace advice given to you by your health care provider. Make sure you discuss any questions you have with your health care provider. Document Released: 05/31/2008 Document Revised: 05/16/2016 Document Reviewed: 11/25/2015 Elsevier Interactive Patient Education  [employer]2018 [employer].     If you need a refill on your cardiac medications before your next appointment, please call your pharmacy.

## 2017-07-30 ENCOUNTER — Other Ambulatory Visit: Payer: Self-pay | Admitting: Cardiology

## 2017-08-03 ENCOUNTER — Encounter: Payer: Self-pay | Admitting: Cardiology

## 2017-08-18 ENCOUNTER — Other Ambulatory Visit: Payer: Self-pay | Admitting: Cardiology

## 2018-04-21 ENCOUNTER — Telehealth: Payer: Self-pay | Admitting: Cardiology

## 2018-04-21 NOTE — Telephone Encounter (Signed)
New message    1. What dental office are you calling from? [facility]   2. What is your office phone number? [phone]   3. What is your fax number? Harriett Sine states their fax machine is having problems and wondered if it could be emailed to them. [email or web address]  4. What type of procedure is the patient having performed? Extractions   5. What date is procedure scheduled or is the patient there now? TBD   6. What is your question (ex. Antibiotics prior to procedure, holding medication-we need to know how long dentist wants pt to hold med)? Office needs to know protocol for pt to come off Brilinta for extractions.

## 2018-04-21 NOTE — Telephone Encounter (Signed)
   Primary Cardiologist: Armanda Magic, MD  Chart reviewed as part of pre-operative protocol coverage. Please clarify how many extractions and under what type of anesthesia.  Laurann Montana, PA-C 04/21/2018, 5:28 PM

## 2018-04-22 NOTE — Telephone Encounter (Signed)
Spoke with [employer] @ [facility]. Pt is having 2 teeth pulled 1 day and 3 teeth on another day.  They are doing 2 different appts. They will be using Lidocaine and Septocaine for anesthesia.  She also advised that her fax machine is back to working and we can fax to [phone].

## 2018-04-23 NOTE — Telephone Encounter (Signed)
Overall very low risk procedure. Last cath 09/29/2012 showed single vessel disease with 70-80% stenosis treated with DES. Currently on 81 mg aspirin and 60mg  BID Brilinta for prevention. Dr. Mayford Knife to review to see if need to hold Brilinta prior to procedure.

## 2018-04-23 NOTE — Telephone Encounter (Signed)
Dentist needs to make decision on holding Brilinta and ASA for procedure.  It is fine to hold for 1 week prior if needed

## 2018-04-24 NOTE — Telephone Encounter (Signed)
I will remove from the Pre Op Pool.

## 2018-04-24 NOTE — Telephone Encounter (Signed)
Surgery Clearance fax received today though this seems to have been addressed by Dr. Mayford Knife. I will fax the clearance to [facility]. I called the dentistry office asking for a fax #. Dentist office states they don't have a working fax # right now. They asked if we could email the clearance to them. I said I was not sure about that. Asked if we could mail it to them. I verified the mailing address for the dental office.

## 2018-06-06 ENCOUNTER — Other Ambulatory Visit: Payer: Self-pay | Admitting: Cardiology

## 2018-06-25 ENCOUNTER — Other Ambulatory Visit: Payer: Self-pay | Admitting: Cardiology

## 2018-07-11 ENCOUNTER — Other Ambulatory Visit: Payer: Self-pay | Admitting: Cardiology

## 2018-08-24 ENCOUNTER — Other Ambulatory Visit: Payer: Self-pay | Admitting: Cardiology

## 2018-09-03 ENCOUNTER — Ambulatory Visit: Payer: 59 | Admitting: Cardiology

## 2018-09-03 ENCOUNTER — Encounter: Payer: Self-pay | Admitting: Cardiology

## 2018-09-03 VITALS — BP 132/86 | HR 55 | Ht 71.0 in | Wt 173.8 lb

## 2018-09-03 DIAGNOSIS — I10 Essential (primary) hypertension: Secondary | ICD-10-CM | POA: Diagnosis not present

## 2018-09-03 DIAGNOSIS — E7800 Pure hypercholesterolemia, unspecified: Secondary | ICD-10-CM

## 2018-09-03 DIAGNOSIS — I2510 Atherosclerotic heart disease of native coronary artery without angina pectoris: Secondary | ICD-10-CM

## 2018-09-03 LAB — LIPID PANEL
Chol/HDL Ratio: 2.6 ratio (ref 0.0–5.0)
Cholesterol, Total: 95 mg/dL — ABNORMAL LOW (ref 100–199)
HDL: 36 mg/dL — ABNORMAL LOW (ref >39)
LDL Calculated: 35 mg/dL (ref 0–99)
Triglycerides: 122 mg/dL (ref 0–149)
VLDL Cholesterol Cal: 24 mg/dL (ref 5–40)

## 2018-09-03 LAB — HEPATIC FUNCTION PANEL
ALT: 33 IU/L (ref 0–44)
AST: 25 IU/L (ref 0–40)
Albumin: 4.8 g/dL (ref 3.8–4.8)
Alkaline Phosphatase: 53 IU/L (ref 39–117)
Bilirubin Total: 0.6 mg/dL (ref 0.0–1.2)
Bilirubin, Direct: 0.18 mg/dL (ref 0.00–0.40)
Total Protein: 7 g/dL (ref 6.0–8.5)

## 2018-09-03 LAB — BASIC METABOLIC PANEL
BUN / CREAT RATIO: 12 (ref 10–24)
BUN: 12 mg/dL (ref 8–27)
CHLORIDE: 102 mmol/L (ref 96–106)
CO2: 23 mmol/L (ref 20–29)
Calcium: 9.7 mg/dL (ref 8.6–10.2)
Creatinine, Ser: 0.99 mg/dL (ref 0.76–1.27)
GFR calc Af Amer: 94 mL/min/{1.73_m2} (ref >59)
GFR calc non Af Amer: 81 mL/min/{1.73_m2} (ref >59)
Glucose: 162 mg/dL — ABNORMAL HIGH (ref 65–99)
Potassium: 4.5 mmol/L (ref 3.5–5.2)
Sodium: 140 mmol/L (ref 134–144)

## 2018-09-03 NOTE — Progress Notes (Signed)
Cardiology Office Note:    Date:  09/03/2018   ID:  Samuel Wallace, DOB Sep 24, 1955, MRN [id number]  PCP:  Elizabeth Palau, FNP  Cardiologist:  Armanda Magic, MD    Referring MD: Elizabeth Palau, FNP   Chief Complaint  Patient presents with  . Coronary Artery Disease  . Hypertension  . Hyperlipidemia    History of Present Illness:    Samuel Wallace is a 63 y.o. male with a hx of ASCAD s/p NSTEMI with DES to the ramus and OM in 2014, HTN and dyslipidemia. He is here today for followup and is doing well.  He denies any chest pain or pressure, SOB, DOE, PND, orthopnea, LE edema, dizziness, palpitations or syncope. He is compliant with his meds and is tolerating meds with no SE.    Past Medical History:  Diagnosis Date  . CAD (coronary artery disease) 09/30/2012   with DES to Ramus and OM with + NSTEMI  . Diabetes mellitus without complication (HCC)   . Hyperlipidemia   . Hypertension     Past Surgical History:  Procedure Laterality Date  . EYE SURGERY     ,detached retina repair..  . LEFT AND RIGHT HEART CATHETERIZATION WITH CORONARY ANGIOGRAM  09/29/2012   Procedure: LEFT AND RIGHT HEART CATHETERIZATION WITH CORONARY ANGIOGRAM;  Surgeon: Lesleigh Noe, MD;  Location: [facility] CATH LAB;  Service: Cardiovascular;;    Current Medications: Current Meds  Medication Sig  . aspirin 81 MG chewable tablet Chew 1 tablet (81 mg total) by mouth daily.  Marland Kitchen GLIPIZIDE XL 5 MG 24 hr tablet Take 5 mg by mouth daily.  Marland Kitchen latanoprost (XALATAN) 0.005 % ophthalmic solution Place 1 drop into the right eye at bedtime.   Marland Kitchen lisinopril (PRINIVIL,ZESTRIL) 20 MG tablet TAKE 1 TABLET BY MOUTH  DAILY  . metFORMIN (GLUCOPHAGE) 500 MG tablet Take 500 mg by mouth 2 (two) times daily with a meal.  . metoprolol tartrate (LOPRESSOR) 25 MG tablet Take 0.5 tablets (12.5 mg total) by mouth 2 (two) times daily. Please keep upcoming appt in March with Dr. Mayford Knife. Thank you  . nitroGLYCERIN (NITROSTAT) 0.4 MG SL  tablet Place 0.4 mg under the tongue every 5 (five) minutes as needed.   Marland Kitchen omega-3 fish oil (MAXEPA) 1000 MG CAPS capsule Take 4 capsules (4,000 mg total) by mouth daily.  . rosuvastatin (CRESTOR) 20 MG tablet TAKE 1 TABLET BY MOUTH  DAILY  . ticagrelor (BRILINTA) 60 MG TABS tablet Take 1 tablet (60 mg total) by mouth 2 (two) times daily. Please keep upcoming appt in March with Dr. Mayford Knife for future refills. Thank you     Allergies:   Patient has no known allergies.   Social History   Socioeconomic History  . Marital status: Married    Spouse name: Not on file  . Number of children: Not on file  . Years of education: Not on file  . Highest education level: Not on file  Occupational History  . Not on file  Social Needs  . Financial resource strain: Not on file  . Food insecurity:    Worry: Not on file    Inability: Not on file  . Transportation needs:    Medical: Not on file    Non-medical: Not on file  Tobacco Use  . Smoking status: Never Smoker  . Smokeless tobacco: Never Used  Substance and Sexual Activity  . Alcohol use: No  . Drug use: No  . Sexual activity: Not on file  Lifestyle  . Physical activity:    Days per week: Not on file    Minutes per session: Not on file  . Stress: Not on file  Relationships  . Social connections:    Talks on phone: Not on file    Gets together: Not on file    Attends religious service: Not on file    Active member of club or organization: Not on file    Attends meetings of clubs or organizations: Not on file    Relationship status: Not on file  Other Topics Concern  . Not on file  Social History Narrative  . Not on file     Family History: The patient's family history includes Diabetes in his mother; Hypertension in his mother.  ROS:   Please see the history of present illness.    ROS  All other systems reviewed and negative.   EKGs/Labs/Other Studies Reviewed:    The following studies were reviewed today: none  EKG:   EKG is  ordered today.  The ekg ordered today demonstrates sinus bradycardia at 55bpm with early repolarization abnormality  Recent Labs: No results found for requested labs within last 8760 hours.   Recent Lipid Panel    Component Value Date/Time   CHOL 98 (L) 07/24/2017 0852   TRIG 112 07/24/2017 0852   HDL 37 (L) 07/24/2017 0852   CHOLHDL 2.6 07/24/2017 0852   CHOLHDL 2.9 07/20/2015 0757   VLDL 24 07/20/2015 0757   LDLCALC 39 07/24/2017 0852    Physical Exam:    VS:  BP 132/86   Pulse (!) 55   Ht 5\' 11"  (1.803 m)   Wt 173 lb 12.8 oz (78.8 kg)   BMI 24.24 kg/m     Wt Readings from Last 3 Encounters:  09/03/18 173 lb 12.8 oz (78.8 kg)  07/24/17 172 lb 3.2 oz (78.1 kg)  07/24/16 176 lb 12.8 oz (80.2 kg)     GEN:  Well nourished, well developed in no acute distress HEENT: Normal NECK: No JVD; No carotid bruits LYMPHATICS: No lymphadenopathy CARDIAC: RRR, no murmurs, rubs, gallops RESPIRATORY:  Clear to auscultation without rales, wheezing or rhonchi  ABDOMEN: Soft, non-tender, non-distended MUSCULOSKELETAL:  No edema; No deformity  SKIN: Warm and dry NEUROLOGIC:  Alert and oriented x 3 PSYCHIATRIC:  Normal affect   ASSESSMENT:    1. Coronary artery disease involving native coronary artery of native heart without angina pectoris   2. Essential hypertension   3. Pure hypercholesterolemia    PLAN:    In order of problems listed above:  1.  ASCAD -  s/p NSTEMI with DES to the ramus and OM in 2014.  He denies any anginal sx.  He will continue on ASA 81mg  daily, statin and BB. I have told him he is fine to stop his Brilinta since he is 6 years out from PCI.  2.  HTN - Bp is controlled on exam today.  He will continue on Lisinopril 20mg  daily and Lopressor 12.5mg  BID.    3.  Hyperlipidemia - LDL goal is < 70.  His last LDL was 39 a year ago.  I will repeat an FLp and ALT.  He will continue on Crestor 20mg  daily     Medication Adjustments/Labs and Tests  Ordered: Current medicines are reviewed at length with the patient today.  Concerns regarding medicines are outlined above.  No orders of the defined types were placed in this encounter.  No orders of the defined types  were placed in this encounter.   Signed, Armanda Magic, MD  09/03/2018 8:32 AM    [facility]

## 2018-09-03 NOTE — Patient Instructions (Signed)
Medication Instructions:  Stop Brilinta   If you need a refill on your cardiac medications before your next appointment, please call your pharmacy.   Lab work: Today: BMET, LFT and Lipids  If you have labs (blood work) drawn today and your tests are completely normal, you will receive your results only by: Marland Kitchen MyChart Message (if you have MyChart) OR . A paper copy in the mail If you have any lab test that is abnormal or we need to change your treatment, we will call you to review the results.  Testing/Procedures: None  Follow-Up: At [facility], you and your health needs are our priority.  As part of our continuing mission to provide you with exceptional heart care, we have created designated Provider Care Teams.  These Care Teams include your primary Cardiologist (physician) and Advanced Practice Providers (APPs -  Physician Assistants and Nurse Practitioners) who all work together to provide you with the care you need, when you need it. You will need a follow up appointment in 1 years.  Please call our office 2 months in advance to schedule this appointment.  You may see Armanda Magic, MD or one of the following Advanced Practice Providers on your designated Care Team:   Home, PA-C Ronie Spies, PA-C . Jacolyn Reedy, PA-C

## 2018-09-07 ENCOUNTER — Other Ambulatory Visit: Payer: Self-pay | Admitting: Cardiology

## 2018-09-10 ENCOUNTER — Telehealth: Payer: Self-pay | Admitting: *Deleted

## 2018-09-10 NOTE — Telephone Encounter (Signed)
-----   Message from Quintella Reichert, MD sent at 09/03/2018  9:31 PM EST ----- Stable labs - continue current meds and forward to PCP

## 2018-09-10 NOTE — Telephone Encounter (Signed)
DPR ok to lvm. Left message lab work stable per Dr. Mayford Knife. Continue on the current Tx plan. If any questions please call [phone]. I will fax a copy to PCP as well.

## 2018-11-17 ENCOUNTER — Other Ambulatory Visit: Payer: Self-pay | Admitting: Cardiology

## 2018-12-01 ENCOUNTER — Other Ambulatory Visit: Payer: Self-pay | Admitting: Cardiology

## 2019-07-22 ENCOUNTER — Other Ambulatory Visit: Payer: Self-pay | Admitting: Cardiology

## 2019-09-10 ENCOUNTER — Encounter: Payer: Self-pay | Admitting: Cardiology

## 2019-09-10 ENCOUNTER — Ambulatory Visit: Payer: 59 | Admitting: Cardiology

## 2019-09-10 ENCOUNTER — Other Ambulatory Visit: Payer: Self-pay

## 2019-09-10 VITALS — BP 122/78 | HR 57 | Ht 71.0 in | Wt 174.8 lb

## 2019-09-10 DIAGNOSIS — I10 Essential (primary) hypertension: Secondary | ICD-10-CM | POA: Diagnosis not present

## 2019-09-10 DIAGNOSIS — E7800 Pure hypercholesterolemia, unspecified: Secondary | ICD-10-CM | POA: Diagnosis not present

## 2019-09-10 DIAGNOSIS — I2510 Atherosclerotic heart disease of native coronary artery without angina pectoris: Secondary | ICD-10-CM

## 2019-09-10 LAB — COMPREHENSIVE METABOLIC PANEL
ALT: 38 IU/L (ref 0–44)
AST: 29 IU/L (ref 0–40)
Albumin/Globulin Ratio: 2.4 — ABNORMAL HIGH (ref 1.2–2.2)
Albumin: 5 g/dL — ABNORMAL HIGH (ref 3.8–4.8)
Alkaline Phosphatase: 52 IU/L (ref 39–117)
BUN/Creatinine Ratio: 11 (ref 10–24)
BUN: 13 mg/dL (ref 8–27)
Bilirubin Total: 0.5 mg/dL (ref 0.0–1.2)
CO2: 25 mmol/L (ref 20–29)
Calcium: 9.9 mg/dL (ref 8.6–10.2)
Chloride: 100 mmol/L (ref 96–106)
Creatinine, Ser: 1.15 mg/dL (ref 0.76–1.27)
GFR calc Af Amer: 78 mL/min/{1.73_m2} (ref >59)
GFR calc non Af Amer: 67 mL/min/{1.73_m2} (ref >59)
Globulin, Total: 2.1 g/dL (ref 1.5–4.5)
Glucose: 167 mg/dL — ABNORMAL HIGH (ref 65–99)
Potassium: 4.8 mmol/L (ref 3.5–5.2)
Sodium: 140 mmol/L (ref 134–144)
Total Protein: 7.1 g/dL (ref 6.0–8.5)

## 2019-09-10 LAB — LIPID PANEL
Chol/HDL Ratio: 2.9 ratio (ref 0.0–5.0)
Cholesterol, Total: 114 mg/dL (ref 100–199)
HDL: 39 mg/dL — ABNORMAL LOW (ref >39)
LDL Chol Calc (NIH): 48 mg/dL (ref 0–99)
Triglycerides: 157 mg/dL — ABNORMAL HIGH (ref 0–149)
VLDL Cholesterol Cal: 27 mg/dL (ref 5–40)

## 2019-09-10 NOTE — Patient Instructions (Signed)
Medication Instructions:  ?Your physician recommends that you continue on your current medications as directed. Please refer to the Current Medication list given to you today. ? ?*If you need a refill on your cardiac medications before your next appointment, please call your pharmacy* ? ? ?Lab Work: ?TODAY: FLP and CMET ?If you have labs (blood work) drawn today and your tests are completely normal, you will receive your results only by: ?MyChart Message (if you have MyChart) OR ?A paper copy in the mail ?If you have any lab test that is abnormal or we need to change your treatment, we will call you to review the results. ? ? ?Follow-Up: ?At [facility], you and your health needs are our priority.  As part of our continuing mission to provide you with exceptional heart care, we have created designated Provider Care Teams.  These Care Teams include your primary Cardiologist (physician) and Advanced Practice Providers (APPs -  Physician Assistants and Nurse Practitioners) who all work together to provide you with the care you need, when you need it. ? ?Your next appointment:   ?1 year(s) ? ?The format for your next appointment:   ?In Person ? ?Provider:   ?Traci Turner, MD   ? ?

## 2019-09-10 NOTE — Progress Notes (Signed)
Cardiology Office Note:    Date:  09/10/2019   ID:  Samuel Wallace, DOB 1956-03-19, MRN [id number]  PCP:  Elizabeth Palau, FNP  Cardiologist:  Armanda Magic, MD    Referring MD: Elizabeth Palau, FNP   Chief Complaint  Patient presents with  . Coronary Artery Disease  . Hypertension  . Hyperlipidemia    History of Present Illness:    Samuel Wallace is a 64 y.o. male with a hx of ASCADs/p NSTEMI with DES to the ramus and OM in 2014, HTN and dyslipidemia.  He is here today for followup and is doing well.  He denies any chest pain or pressure, SOB, DOE, PND, orthopnea, LE edema, dizziness, palpitations or syncope. He is compliant with his meds and is tolerating meds with no SE.    Past Medical History:  Diagnosis Date  . CAD (coronary artery disease) 09/30/2012   with DES to Ramus and OM with + NSTEMI  . Diabetes mellitus without complication (HCC)   . Hyperlipidemia   . Hypertension     Past Surgical History:  Procedure Laterality Date  . EYE SURGERY     ,detached retina repair..  . LEFT AND RIGHT HEART CATHETERIZATION WITH CORONARY ANGIOGRAM  09/29/2012   Procedure: LEFT AND RIGHT HEART CATHETERIZATION WITH CORONARY ANGIOGRAM;  Surgeon: Lesleigh Noe, MD;  Location[facility] C[facility] LAB;  Service: Cardiovascular;;    Current Medications: Current Meds  Medication Sig  . aspirin 81 MG chewable tablet Chew 1 tablet (81 mg total) by mouth daily.  Marland Kitchen GLIPIZIDE XL 5 MG 24 hr tablet Take 5 mg by mouth daily.  Marland Kitchen JARDIANCE 25 MG TABS tablet Take 25 mg by mouth daily.  Marland Kitchen latanoprost (XALATAN) 0.005 % ophthalmic solution Place 1 drop into the right eye at bedtime.   Marland Kitchen lisinopril (ZESTRIL) 20 MG tablet TAKE 1 TABLET BY MOUTH ONCE DAILY  . metFORMIN (GLUCOPHAGE) 500 MG tablet Take 500 mg by mouth 2 (two) times daily with a meal.  . metoprolol tartrate (LOPRESSOR) 25 MG tablet Take 0.5 tablets (12.5 mg total) by mouth 2 (two) times daily. Please make yearly appt with Dr. Mayford Knife for March  before anymore refills. 1st attempt  . nitroGLYCERIN (NITROSTAT) 0.4 MG SL tablet Place 0.4 mg under the tongue every 5 (five) minutes as needed.   Marland Kitchen omega-3 fish oil (MAXEPA) 1000 MG CAPS capsule Take 4 capsules (4,000 mg total) by mouth daily.  . rosuvastatin (CRESTOR) 20 MG tablet TAKE 1 TABLET BY MOUTH ONCE DAILY     Allergies:   Patient has no known allergies.   Social History   Socioeconomic History  . Marital status: Married    Spouse name: Not on file  . Number of children: Not on file  . Years of education: Not on file  . Highest education level: Not on file  Occupational History  . Not on file  Tobacco Use  . Smoking status: Never Smoker  . Smokeless tobacco: Never Used  Substance and Sexual Activity  . Alcohol use: No  . Drug use: No  . Sexual activity: Not on file  Other Topics Concern  . Not on file  Social History Narrative  . Not on file   Social Determinants of Health   Financial Resource Strain:   . Difficulty of Paying Living Expenses:   Food Insecurity:   . Worried About Programme researcher, broadcasting/film/video in the Last Year:   . [employer] of Food in the Last Year:  Transportation Needs:   . Film/video editor (Medical):   Marland Kitchen Lack of Transportation (Non-Medical):   Physical Activity:   . Days of Exercise per Week:   . Minutes of Exercise per Session:   Stress:   . Feeling of Stress :   Social Connections:   . Frequency of Communication with Friends and Family:   . Frequency of Social Gatherings with Friends and Family:   . Attends Religious Services:   . Active Member of Clubs or Organizations:   . Attends Archivist Meetings:   Marland Kitchen Marital Status:      Family History: The patient's family history includes Diabetes in his mother; Hypertension in his mother.  ROS:   Please see the history of present illness.    ROS  All other systems reviewed and negative.   EKGs/Labs/Other Studies Reviewed:    The following studies were reviewed  today: none  EKG:  EKG is  ordered today.  The ekg ordered today demonstrates sinus bradycardia at 54bpm with early repolarization  Recent Labs: No results found for requested labs within last 8760 hours.   Recent Lipid Panel    Component Value Date/Time   CHOL 95 (L) 09/03/2018 0856   TRIG 122 09/03/2018 0856   HDL 36 (L) 09/03/2018 0856   CHOLHDL 2.6 09/03/2018 0856   CHOLHDL 2.9 07/20/2015 0757   VLDL 24 07/20/2015 0757   LDLCALC 35 09/03/2018 0856    Physical Exam:    VS:  BP 122/78   Pulse (!) 57   Ht 5\' 11"  (1.803 m)   Wt 174 lb 12.8 oz (79.3 kg)   SpO2 96%   BMI 24.38 kg/m     Wt Readings from Last 3 Encounters:  09/10/19 174 lb 12.8 oz (79.3 kg)  09/03/18 173 lb 12.8 oz (78.8 kg)  07/24/17 172 lb 3.2 oz (78.1 kg)     GEN:  Well nourished, well developed in no acute distress HEENT: Normal NECK: No JVD; No carotid bruits LYMPHATICS: No lymphadenopathy CARDIAC: RRR, no murmurs, rubs, gallops RESPIRATORY:  Clear to auscultation without rales, wheezing or rhonchi  ABDOMEN: Soft, non-tender, non-distended MUSCULOSKELETAL:  No edema; No deformity  SKIN: Warm and dry NEUROLOGIC:  Alert and oriented x 3 PSYCHIATRIC:  Normal affect   ASSESSMENT:    1. Coronary artery disease involving native coronary artery of native heart without angina pectoris   2. Essential hypertension   3. Pure hypercholesterolemia    PLAN:    In order of problems listed above:  1.  ASCAD -s/p NSTEMI with DES to the ramus and OM in 2014 -he denies any  2.  HTN -BP controlled -continue Lopressor 12.5mg  BID and Lisinopril 20mg  daily  3.  HLD -LDL goal is < 70 -repeat FLP and ALT -continue Crestor 20mg  daily   Medication Adjustments/Labs and Tests Ordered: Current medicines are reviewed at length with the patient today.  Concerns regarding medicines are outlined above.  Orders Placed This Encounter  Procedures  . EKG 12-Lead   No orders of the defined types were placed  in this encounter.   Signed, [name], MD  09/10/2019 8:12 AM    [place]

## 2019-10-11 ENCOUNTER — Other Ambulatory Visit: Payer: Self-pay | Admitting: Cardiology

## 2019-10-25 ENCOUNTER — Other Ambulatory Visit: Payer: Self-pay | Admitting: Cardiology

## 2019-11-10 ENCOUNTER — Other Ambulatory Visit: Payer: Self-pay | Admitting: Cardiology

## 2020-08-20 ENCOUNTER — Other Ambulatory Visit: Payer: Self-pay | Admitting: Cardiology

## 2020-08-28 ENCOUNTER — Other Ambulatory Visit: Payer: Self-pay | Admitting: Cardiology

## 2020-09-22 ENCOUNTER — Encounter: Payer: Self-pay | Admitting: Cardiology

## 2020-09-22 ENCOUNTER — Ambulatory Visit: Payer: 59 | Admitting: Cardiology

## 2020-09-22 ENCOUNTER — Other Ambulatory Visit: Payer: Self-pay

## 2020-09-22 VITALS — BP 112/72 | HR 72 | Ht 71.0 in | Wt 169.0 lb

## 2020-09-22 DIAGNOSIS — I10 Essential (primary) hypertension: Secondary | ICD-10-CM

## 2020-09-22 DIAGNOSIS — I2510 Atherosclerotic heart disease of native coronary artery without angina pectoris: Secondary | ICD-10-CM | POA: Diagnosis not present

## 2020-09-22 DIAGNOSIS — R011 Cardiac murmur, unspecified: Secondary | ICD-10-CM | POA: Diagnosis not present

## 2020-09-22 DIAGNOSIS — E7800 Pure hypercholesterolemia, unspecified: Secondary | ICD-10-CM

## 2020-09-22 LAB — LIPID PANEL
Chol/HDL Ratio: 2.7 ratio (ref 0.0–5.0)
Cholesterol, Total: 107 mg/dL (ref 100–199)
HDL: 40 mg/dL (ref >39)
LDL Chol Calc (NIH): 45 mg/dL (ref 0–99)
Triglycerides: 125 mg/dL (ref 0–149)
VLDL Cholesterol Cal: 22 mg/dL (ref 5–40)

## 2020-09-22 LAB — COMPREHENSIVE METABOLIC PANEL
ALT: 30 IU/L (ref 0–44)
AST: 28 IU/L (ref 0–40)
Albumin/Globulin Ratio: 2.2 (ref 1.2–2.2)
Albumin: 5 g/dL — ABNORMAL HIGH (ref 3.8–4.8)
Alkaline Phosphatase: 61 IU/L (ref 44–121)
BUN/Creatinine Ratio: 12 (ref 10–24)
BUN: 13 mg/dL (ref 8–27)
Bilirubin Total: 0.8 mg/dL (ref 0.0–1.2)
CO2: 23 mmol/L (ref 20–29)
Calcium: 9.9 mg/dL (ref 8.6–10.2)
Chloride: 98 mmol/L (ref 96–106)
Creatinine, Ser: 1.09 mg/dL (ref 0.76–1.27)
Globulin, Total: 2.3 g/dL (ref 1.5–4.5)
Glucose: 114 mg/dL — ABNORMAL HIGH (ref 65–99)
Potassium: 4.5 mmol/L (ref 3.5–5.2)
Sodium: 140 mmol/L (ref 134–144)
Total Protein: 7.3 g/dL (ref 6.0–8.5)
eGFR: 76 mL/min/{1.73_m2} (ref >59)

## 2020-09-22 NOTE — Patient Instructions (Signed)
Medication Instructions:  Your physician recommends that you continue on your current medications as directed. Please refer to the Current Medication list given to you today.  *If you need a refill on your cardiac medications before your next appointment, please call your pharmacy*   Lab Work: TODAY: Fasting lipids and CMET If you have labs (blood work) drawn today and your tests are completely normal, you will receive your results only by: Marland Kitchen MyChart Message (if you have MyChart) OR . A paper copy in the mail If you have any lab test that is abnormal or we need to change your treatment, we will call you to review the results.   Testing/Procedures: Your physician has requested that you have an echocardiogram. Echocardiography is a painless test that uses sound waves to create images of your heart. It provides your doctor with information about the size and shape of your heart and how well your heart's chambers and valves are working. This procedure takes approximately one hour. There are no restrictions for this procedure.   Follow-Up: At [facility], you and your health needs are our priority.  As part of our continuing mission to provide you with exceptional heart care, we have created designated Provider Care Teams.  These Care Teams include your primary Cardiologist (physician) and Advanced Practice Providers (APPs -  Physician Assistants and Nurse Practitioners) who all work together to provide you with the care you need, when you need it.  Your next appointment:   1 year(s)  The format for your next appointment:   In Person  Provider:   You may see Armanda Magic, MD or one of the following Advanced Practice Providers on your designated Care Team:    Ronie Spies, PA-C  Jacolyn Reedy, PA-C

## 2020-09-22 NOTE — Progress Notes (Addendum)
Cardiology Office Note:    Date:  09/22/2020   ID:  Samuel Wallace, DOB 04/17/1956, MRN [id number]  PCP:  Elizabeth Palau, FNP  Cardiologist:  Armanda Magic, MD    Referring MD: Elizabeth Palau, FNP   Chief Complaint  Patient presents with  . Coronary Artery Disease  . Hypertension  . Hyperlipidemia    History of Present Illness:    Samuel Wallace is a 65 y.o. male with a hx of ASCADs/p NSTEMI with DES to the ramus and OM in 2014, HTN and dyslipidemia.  He is here today for followup and is doing well.  He denies any chest pain or pressure, SOB, DOE, PND, orthopnea, LE edema, dizziness, palpitations or syncope. He is compliant with his meds and is tolerating meds with no SE.    Past Medical History:  Diagnosis Date  . CAD (coronary artery disease) 09/30/2012   with DES to Ramus and OM with + NSTEMI  . Diabetes mellitus without complication (HCC)   . Hyperlipidemia   . Hypertension     Past Surgical History:  Procedure Laterality Date  . EYE SURGERY     ,detached retina repair..  . LEFT AND RIGHT HEART CATHETERIZATION WITH CORONARY ANGIOGRAM  09/29/2012   Procedure: LEFT AND RIGHT HEART CATHETERIZATION WITH CORONARY ANGIOGRAM;  Surgeon: Lesleigh Noe, MD;  Location: [facility] CATH LAB;  Service: Cardiovascular;;    Current Medications: Current Meds  Medication Sig  . aspirin 81 MG chewable tablet Chew 1 tablet (81 mg total) by mouth daily.  . Empagliflozin-metFORMIN HCl (SYNJARDY) 12.10-998 MG TABS Take 1 tablet by mouth 2 (two) times daily with a meal.  . GLIPIZIDE XL 5 MG 24 hr tablet Take 5 mg by mouth daily.  Marland Kitchen latanoprost (XALATAN) 0.005 % ophthalmic solution Place 1 drop into the right eye at bedtime.   Marland Kitchen lisinopril (ZESTRIL) 20 MG tablet TAKE 1 TABLET BY MOUTH ONCE DAILY  . metoprolol tartrate (LOPRESSOR) 25 MG tablet TAKE ONE-HALF TABLET BY  MOUTH TWICE DAILY  . nitroGLYCERIN (NITROSTAT) 0.4 MG SL tablet Place 0.4 mg under the tongue every 5 (five) minutes as  needed.   Marland Kitchen omega-3 fish oil (MAXEPA) 1000 MG CAPS capsule Take 4 capsules (4,000 mg total) by mouth daily.  . rosuvastatin (CRESTOR) 20 MG tablet TAKE 1 TABLET BY MOUTH ONCE DAILY  . Semaglutide 14 MG TABS Take 14 mg by mouth daily.     Allergies:   Patient has no known allergies.   Social History   Socioeconomic History  . Marital status: Married    Spouse name: Not on file  . Number of children: Not on file  . Years of education: Not on file  . Highest education level: Not on file  Occupational History  . Not on file  Tobacco Use  . Smoking status: Never Smoker  . Smokeless tobacco: Never Used  Substance and Sexual Activity  . Alcohol use: No  . Drug use: No  . Sexual activity: Not on file  Other Topics Concern  . Not on file  Social History Narrative  . Not on file   Social Determinants of Health   Financial Resource Strain: Not on file  Food Insecurity: Not on file  Transportation Needs: Not on file  Physical Activity: Not on file  Stress: Not on file  Social Connections: Not on file     Family History: The patient's family history includes Diabetes in his mother; Hypertension in his mother.  ROS:  Please see the history of present illness.    ROS  All other systems reviewed and negative.   EKGs/Labs/Other Studies Reviewed:    The following studies were reviewed today: none  EKG:  EKG is  ordered today.  The ekg ordered today demonstrates sinus bradycardia at 54bpm with early repolarization  Recent Labs: No results found for requested labs within last 8760 hours.   Recent Lipid Panel    Component Value Date/Time   CHOL 114 09/10/2019 0822   TRIG 157 (H) 09/10/2019 0822   HDL 39 (L) 09/10/2019 0822   CHOLHDL 2.9 09/10/2019 0822   CHOLHDL 2.9 07/20/2015 0757   VLDL 24 07/20/2015 0757   LDLCALC 48 09/10/2019 0822    Physical Exam:    VS:  BP 112/72   Pulse 72   Ht 5\' 11"  (1.803 m)   Wt 169 lb (76.7 kg)   SpO2 99%   BMI 23.57 kg/m      Wt Readings from Last 3 Encounters:  09/22/20 169 lb (76.7 kg)  09/10/19 174 lb 12.8 oz (79.3 kg)  09/03/18 173 lb 12.8 oz (78.8 kg)     GEN: Well nourished, well developed in no acute distress HEENT: Normal NECK: No JVD; No carotid bruits LYMPHATICS: No lymphadenopathy CARDIAC:RRR, no rubs, gallops.  2/6 SM at RUSB RESPIRATORY:  Clear to auscultation without rales, wheezing or rhonchi  ABDOMEN: Soft, non-tender, non-distended MUSCULOSKELETAL:  No edema; No deformity  SKIN: Warm and dry NEUROLOGIC:  Alert and oriented x 3 PSYCHIATRIC:  Normal affect    ASSESSMENT:    1. Coronary artery disease involving native coronary artery of native heart without angina pectoris   2. Primary hypertension   3. Pure hypercholesterolemia   4. Heart murmur    PLAN:    In order of problems listed above:  1.  ASCAD -s/p NSTEMI with DES to the ramus and OM in 2014 -he has not had any anginal symptoms  2.  HTN -BP controlled on exam -continue Lopressor 12.5mg  BID and Lisinopril 20mg  daily  3.  HLD -LDL goal is < 70 -LDL was 48 in March 2021 -repeat FLP and ALT -continue Crestor 20mg  daily  4.  Heart Murmur -sounds like AVSC -I will get a 2D echo to assess  Medication Adjustments/Labs and Tests Ordered: Current medicines are reviewed at length with the patient today.  Concerns regarding medicines are outlined above.  Orders Placed This Encounter  Procedures  . EKG 12-Lead   No orders of the defined types were placed in this encounter.   Signed, , MD  09/22/2020 8:24 AM    [facility]

## 2020-09-22 NOTE — Addendum Note (Signed)
Addended by: Theresia Majors on: 09/22/2020 08:29 AM   Modules accepted: Orders

## 2020-10-19 ENCOUNTER — Encounter: Payer: Self-pay | Admitting: Cardiology

## 2020-10-19 ENCOUNTER — Ambulatory Visit (HOSPITAL_COMMUNITY): Payer: 59 | Attending: Cardiovascular Disease

## 2020-10-19 ENCOUNTER — Other Ambulatory Visit: Payer: Self-pay

## 2020-10-19 ENCOUNTER — Telehealth: Payer: Self-pay

## 2020-10-19 DIAGNOSIS — R011 Cardiac murmur, unspecified: Secondary | ICD-10-CM | POA: Diagnosis not present

## 2020-10-19 DIAGNOSIS — I350 Nonrheumatic aortic (valve) stenosis: Secondary | ICD-10-CM

## 2020-10-19 LAB — ECHOCARDIOGRAM COMPLETE
AR max vel: 1.12 cm2
AV Area VTI: 1.11 cm2
AV Area mean vel: 1.01 cm2
AV Mean grad: 15.7 mmHg
AV Peak grad: 27.9 mmHg
Ao pk vel: 2.64 m/s
Area-P 1/2: 2.52 cm2
S' Lateral: 2.2 cm

## 2020-10-19 NOTE — Telephone Encounter (Signed)
-----   Message from Quintella Reichert, MD sent at 10/19/2020  3:35 PM EDT ----- Normal heart function with moderate AS and increased stiffness of heart muscle.  REpeat echo in 1 year for AS

## 2020-10-19 NOTE — Telephone Encounter (Signed)
The patient has been notified of the result and verbalized understanding.  All questions (if any) were answered. Theresia Majors, RN 10/19/2020 3:46 PM

## 2020-11-23 ENCOUNTER — Other Ambulatory Visit: Payer: Self-pay | Admitting: Cardiology

## 2021-08-23 ENCOUNTER — Telehealth (HOSPITAL_COMMUNITY): Payer: Self-pay | Admitting: Cardiology

## 2021-08-23 NOTE — Telephone Encounter (Signed)
I called patient to schedule echocardiogram in April that was was ordered by Dr. Mayford Knife.  Patient did not want to schedule due to the expense from the one he had last year. He will speak with Dr. Mayford Knife at his yearly checkup and will go from there. Order will be removed from the echo WQ.

## 2021-11-07 ENCOUNTER — Encounter: Payer: Self-pay | Admitting: Cardiology

## 2021-11-07 ENCOUNTER — Ambulatory Visit: Payer: 59 | Admitting: Cardiology

## 2021-11-07 VITALS — BP 128/74 | HR 69 | Ht 71.0 in | Wt 157.2 lb

## 2021-11-07 DIAGNOSIS — E7800 Pure hypercholesterolemia, unspecified: Secondary | ICD-10-CM

## 2021-11-07 DIAGNOSIS — I10 Essential (primary) hypertension: Secondary | ICD-10-CM | POA: Diagnosis not present

## 2021-11-07 DIAGNOSIS — I2510 Atherosclerotic heart disease of native coronary artery without angina pectoris: Secondary | ICD-10-CM

## 2021-11-07 DIAGNOSIS — I350 Nonrheumatic aortic (valve) stenosis: Secondary | ICD-10-CM

## 2021-11-07 NOTE — Patient Instructions (Signed)
Medication Instructions:  ?Your physician recommends that you continue on your current medications as directed. Please refer to the Current Medication list given to you today. ? ?*If you need a refill on your cardiac medications before your next appointment, please call your pharmacy* ? ?Testing/Procedures: ?Your physician has requested that you have an echocardiogram in November 2023. Echocardiography is a painless test that uses sound waves to create images of your heart. It provides your doctor with information about the size and shape of your heart and how well your heart?s chambers and valves are working. This procedure takes approximately one hour. There are no restrictions for this procedure. ? ? ?Follow-Up: ?At [facility], you and your health needs are our priority.  As part of our continuing mission to provide you with exceptional heart care, we have created designated Provider Care Teams.  These Care Teams include your primary Cardiologist (physician) and Advanced Practice Providers (APPs -  Physician Assistants and Nurse Practitioners) who all work together to provide you with the care you need, when you need it. ? ? ?Your next appointment:   ?1 year ? ?The format for your next appointment:   ?In Person ? ?Provider:  ?Dr. Mayford Knife ? ?Important Information About Sugar ? ? ? ? ?  ?

## 2021-11-07 NOTE — Progress Notes (Signed)
?Cardiology Office Note:   ? ?Date:  11/07/2021  ? ?ID:  Samuel Wallace, DOB 1956/06/15, MRN [id number] ? ?PCP:  Elizabeth Palau, FNP  ?Cardiologist:  Armanda Magic, MD   ? ?Referring MD: Elizabeth Palau, FNP  ? ?Chief Complaint  ?Patient presents with  ? Coronary Artery Disease  ? Hypertension  ? Hyperlipidemia  ? Aortic Stenosis  ? ? ?History of Present Illness:   ? ?Samuel Wallace is a 66 y.o. male with a hx of ASCAD s/p NSTEMI with DES to the ramus and OM in 2014, HTN and dyslipidemia.  He is here today for followup and is doing well.  He denies any chest pain or pressure, SOB, DOE, PND, orthopnea, LE edema, dizziness, palpitations or syncope. He is compliant with his meds and is tolerating meds with no SE.    ?Past Medical History:  ?Diagnosis Date  ? Aortic stenosis   ? moderate by echo 09/2020 with mean AVG  ? CAD (coronary artery disease) 09/30/2012  ? with DES to Ramus and OM with + NSTEMI  ? Diabetes mellitus without complication (HCC)   ? Hyperlipidemia   ? Hypertension   ? ? ?Past Surgical History:  ?Procedure Laterality Date  ? EYE SURGERY    ? ,detached retina repair..  ? LEFT AND RIGHT HEART CATHETERIZATION WITH CORONARY ANGIOGRAM  09/29/2012  ? Procedure: LEFT AND RIGHT HEART CATHETERIZATION WITH CORONARY ANGIOGRAM;  Surgeon: Lesleigh Noe, MD;  Location: [facility] CATH LAB;  Service: Cardiovascular;;  ? ? ?Current Medications: ?Current Meds  ?Medication Sig  ? aspirin 81 MG chewable tablet Chew 1 tablet (81 mg total) by mouth daily.  ? doxycycline (MONODOX) 100 MG capsule Take 1 capsule by mouth as directed.  ? Empagliflozin-metFORMIN HCl (SYNJARDY) 12.10-998 MG TABS Take 1 tablet by mouth 2 (two) times daily with a meal.  ? latanoprost (XALATAN) 0.005 % ophthalmic solution Place 1 drop into the right eye at bedtime.   ? lisinopril (ZESTRIL) 20 MG tablet TAKE 1 TABLET BY MOUTH ONCE DAILY  ? metoprolol tartrate (LOPRESSOR) 25 MG tablet TAKE ONE-HALF TABLET BY  MOUTH TWICE DAILY  ? mupirocin  ointment (BACTROBAN) 2 % Apply topically as needed.  ? nitroGLYCERIN (NITROSTAT) 0.4 MG SL tablet Place 0.4 mg under the tongue every 5 (five) minutes as needed.   ? omega-3 fish oil (MAXEPA) 1000 MG CAPS capsule Take 4 capsules (4,000 mg total) by mouth daily.  ? rosuvastatin (CRESTOR) 20 MG tablet TAKE 1 TABLET BY MOUTH ONCE DAILY  ? Semaglutide 14 MG TABS Take 14 mg by mouth daily.  ?  ? ?Allergies:   Patient has no known allergies.  ? ?Social History  ? ?Socioeconomic History  ? Marital status: Married  ?  Spouse name: Not on file  ? Number of children: Not on file  ? Years of education: Not on file  ? Highest education level: Not on file  ?Occupational History  ? Not on file  ?Tobacco Use  ? Smoking status: Never  ? Smokeless tobacco: Never  ?Substance and Sexual Activity  ? Alcohol use: No  ? Drug use: No  ? Sexual activity: Not on file  ?Other Topics Concern  ? Not on file  ?Social History Narrative  ? Not on file  ? ?Social Determinants of Health  ? ?Financial Resource Strain: Not on file  ?Food Insecurity: Not on file  ?Transportation Needs: Not on file  ?Physical Activity: Not on file  ?Stress: Not on file  ?  Social Connections: Not on file  ?  ? ?Family History: ?The patient's family history includes Diabetes in his mother; Hypertension in his mother. ? ?ROS:   ?Please see the history of present illness.    ?ROS  ?All other systems reviewed and negative.  ? ?EKGs/Labs/Other Studies Reviewed:   ? ?The following studies were reviewed today: ?none ? ?EKG:  EKG is  ordered today.  The ekg ordered today demonstrates NSR with early repol ? ?Recent Labs: ?No results found for requested labs within last 8760 hours.  ? ?Recent Lipid Panel ?   ?Component Value Date/Time  ? CHOL 107 09/22/2020 0834  ? TRIG 125 09/22/2020 0834  ? HDL 40 09/22/2020 0834  ? CHOLHDL 2.7 09/22/2020 0834  ? CHOLHDL 2.9 07/20/2015 0757  ? VLDL 24 07/20/2015 0757  ? LDLCALC 45 09/22/2020 0834  ? ? ?Physical Exam:   ? ?VS:  BP 128/74   Pulse  69   Ht 5\' 11"  (1.803 m)   Wt 157 lb 3.2 oz (71.3 kg)   SpO2 98%   BMI 21.92 kg/m?    ? ?Wt Readings from Last 3 Encounters:  ?11/07/21 157 lb 3.2 oz (71.3 kg)  ?09/22/20 169 lb (76.7 kg)  ?09/10/19 174 lb 12.8 oz (79.3 kg)  ?  ?GEN: Well nourished, well developed in no acute distress ?HEENT: Normal ?NECK: No JVD; No carotid bruits ?LYMPHATICS: No lymphadenopathy ?CARDIAC:RRR, no murmurs, rubs, gallops ?RESPIRATORY:  Clear to auscultation without rales, wheezing or rhonchi  ?ABDOMEN: Soft, non-tender, non-distended ?MUSCULOSKELETAL:  No edema; No deformity  ?SKIN: Warm and dry ?NEUROLOGIC:  Alert and oriented x 3 ?PSYCHIATRIC:  Normal affect   ? ?ASSESSMENT:   ? ?1. Coronary artery disease involving native coronary artery of native heart without angina pectoris   ?2. Primary hypertension   ?3. Pure hypercholesterolemia   ?4. Nonrheumatic aortic valve stenosis   ? ?PLAN:   ? ?In order of problems listed above: ? ?1.  ASCAD ?- s/p NSTEMI with DES to the ramus and OM in 2014 ?-He denies any anginal symptoms since I saw him last ?-Continue prescription drug management with aspirin 81 mg daily, Lopressor 25 mg 1/2 tablet twice daily and Crestor 20 mg daily with as needed refills ? ?2.  HTN ?-BP is well controlled on exam today ?-Continue prescription drug management with lisinopril 20 mg daily and Lopressor 12.5 mg twice daily with as needed refills ?-I have personally reviewed and interpreted outside labs performed by patient's PCP which showed serum creatinine 1.09 and potassium 4.5 on 09/22/2020 ? ?3.  HLD ?-LDL goal is < 70 ?-Check FLP and ALT ?-Continue prescription drug management Crestor 20 mg daily with as needed refills ? ?4.  Aortic stenosis ?-Moderate by 2D echo 10/19/2020 ?-Repeat 2D echo to make sure this is stable ? ?Medication Adjustments/Labs and Tests Ordered: ?Current medicines are reviewed at length with the patient today.  Concerns regarding medicines are outlined above.  ?Orders Placed This  Encounter  ?Procedures  ? EKG 12-Lead  ? ?No orders of the defined types were placed in this encounter. ? ? ?Signed, ?10/21/2020, MD  ?11/07/2021 8:56 AM    ?[facility] ?

## 2021-11-07 NOTE — Addendum Note (Signed)
Addended by: Antonieta Iba on: 11/07/2021 09:05 AM ? ? Modules accepted: Orders ? ?

## 2021-12-18 ENCOUNTER — Other Ambulatory Visit: Payer: Self-pay | Admitting: Cardiology

## 2021-12-21 ENCOUNTER — Other Ambulatory Visit: Payer: Self-pay

## 2022-05-14 ENCOUNTER — Encounter: Payer: Self-pay | Admitting: Cardiology

## 2022-05-14 ENCOUNTER — Ambulatory Visit (HOSPITAL_COMMUNITY): Payer: 59 | Attending: Cardiology

## 2022-05-14 DIAGNOSIS — E7800 Pure hypercholesterolemia, unspecified: Secondary | ICD-10-CM | POA: Insufficient documentation

## 2022-05-14 DIAGNOSIS — I350 Nonrheumatic aortic (valve) stenosis: Secondary | ICD-10-CM | POA: Diagnosis not present

## 2022-05-14 DIAGNOSIS — I10 Essential (primary) hypertension: Secondary | ICD-10-CM | POA: Diagnosis not present

## 2022-05-14 DIAGNOSIS — I2510 Atherosclerotic heart disease of native coronary artery without angina pectoris: Secondary | ICD-10-CM | POA: Diagnosis present

## 2022-05-14 LAB — ECHOCARDIOGRAM COMPLETE
AR max vel: 1.05 cm2
AV Area VTI: 1.06 cm2
AV Area mean vel: 0.98 cm2
AV Mean grad: 25 mmHg
AV Peak grad: 41.2 mmHg
Ao pk vel: 3.21 m/s
Area-P 1/2: 3.29 cm2
S' Lateral: 2.6 cm

## 2022-05-15 ENCOUNTER — Telehealth: Payer: Self-pay

## 2022-05-15 DIAGNOSIS — I350 Nonrheumatic aortic (valve) stenosis: Secondary | ICD-10-CM

## 2022-05-15 NOTE — Telephone Encounter (Signed)
-----   Message from Eilleen Kempf, RN sent at 05/14/2022  6:00 PM EST -----  ----- Message ----- From: Quintella Reichert, MD Sent: 05/14/2022  11:20 AM EST To: [facility] Triage  2D echo showed normal heart function with increased stiffness of heart muscle called diastolic dysfunction, moderately calcified AV with moderate AS with slightly increased gradient from last year but still moderate AS -  repeat echo in 1 year for AS

## 2022-05-15 NOTE — Telephone Encounter (Signed)
Reviewed echo results with patient. Patient verbalized understanding and had no questions. Repeat ECHO one year.

## 2022-10-25 ENCOUNTER — Other Ambulatory Visit: Payer: Self-pay | Admitting: Cardiology

## 2022-12-06 ENCOUNTER — Other Ambulatory Visit: Payer: Self-pay | Admitting: Cardiology

## 2022-12-15 ENCOUNTER — Other Ambulatory Visit: Payer: Self-pay | Admitting: Cardiology

## 2023-02-11 ENCOUNTER — Other Ambulatory Visit: Payer: Self-pay | Admitting: Cardiology

## 2023-02-25 ENCOUNTER — Other Ambulatory Visit: Payer: Self-pay | Admitting: Cardiology

## 2023-04-03 ENCOUNTER — Encounter: Payer: Self-pay | Admitting: Cardiology

## 2023-04-03 ENCOUNTER — Ambulatory Visit (HOSPITAL_COMMUNITY): Payer: 59 | Attending: Cardiology

## 2023-04-03 DIAGNOSIS — I350 Nonrheumatic aortic (valve) stenosis: Secondary | ICD-10-CM | POA: Diagnosis present

## 2023-04-03 LAB — ECHOCARDIOGRAM COMPLETE
AR max vel: 1.15 cm2
AV Area VTI: 1.15 cm2
AV Area mean vel: 1.04 cm2
AV Mean grad: 23 mm[Hg]
AV Peak grad: 42 mm[Hg]
Ao pk vel: 3.24 m/s
Area-P 1/2: 3.42 cm2
S' Lateral: 2.4 cm

## 2023-04-06 ENCOUNTER — Other Ambulatory Visit: Payer: Self-pay | Admitting: Cardiology

## 2023-04-09 ENCOUNTER — Telehealth: Payer: Self-pay

## 2023-04-09 DIAGNOSIS — I350 Nonrheumatic aortic (valve) stenosis: Secondary | ICD-10-CM

## 2023-04-09 NOTE — Telephone Encounter (Signed)
-----   Message from Armanda Magic sent at 04/03/2023  9:01 PM EDT ----- Echo showed normal pumping function of the heart.  Mildly calcified MV .  Moderately calcified AV with moderate stenosis. Please repeat echo in 1 year

## 2023-04-09 NOTE — Telephone Encounter (Signed)
Call to patient to explain that Echo showed normal pumping function of the heart, Mildly calcified MV  and Moderately calcified AV with moderate stenosis. Patient verbalizes understanding, agrees to repeat echo in 1 year. Orders placed.

## 2023-04-10 ENCOUNTER — Ambulatory Visit: Payer: 59 | Admitting: Cardiology

## 2023-04-20 ENCOUNTER — Other Ambulatory Visit: Payer: Self-pay | Admitting: Cardiology

## 2023-04-24 ENCOUNTER — Other Ambulatory Visit: Payer: Self-pay | Admitting: Cardiology

## 2023-04-30 ENCOUNTER — Ambulatory Visit: Payer: 59 | Attending: Cardiology | Admitting: Cardiology

## 2023-04-30 ENCOUNTER — Encounter: Payer: Self-pay | Admitting: Cardiology

## 2023-04-30 VITALS — BP 148/82 | HR 66 | Ht 71.0 in | Wt 165.2 lb

## 2023-04-30 DIAGNOSIS — I2510 Atherosclerotic heart disease of native coronary artery without angina pectoris: Secondary | ICD-10-CM

## 2023-04-30 DIAGNOSIS — I10 Essential (primary) hypertension: Secondary | ICD-10-CM | POA: Diagnosis not present

## 2023-04-30 DIAGNOSIS — I350 Nonrheumatic aortic (valve) stenosis: Secondary | ICD-10-CM | POA: Diagnosis not present

## 2023-04-30 DIAGNOSIS — E7800 Pure hypercholesterolemia, unspecified: Secondary | ICD-10-CM | POA: Diagnosis not present

## 2023-04-30 NOTE — Patient Instructions (Signed)
Medication Instructions:  Your physician recommends that you continue on your current medications as directed. Please refer to the Current Medication list given to you today.  *If you need a refill on your cardiac medications before your next appointment, please call your pharmacy*   Lab Work: None.  If you have labs (blood work) drawn today and your tests are completely normal, you will receive your results only by: MyChart Message (if you have MyChart) OR A paper copy in the mail If you have any lab test that is abnormal or we need to change your treatment, we will call you to review the results.   Testing/Procedures: Your physician has requested that you have an echocardiogram in October of 2025. Echocardiography is a painless test that uses sound waves to create images of your heart. It provides your doctor with information about the size and shape of your heart and how well your heart's chambers and valves are working. This procedure takes approximately one hour. There are no restrictions for this procedure. Please do NOT wear cologne, perfume, aftershave, or lotions (deodorant is allowed). Please arrive 15 minutes prior to your appointment time.    Follow-Up:   Your next appointment:   1 year(s)  Provider:   Armanda Magic, MD

## 2023-04-30 NOTE — Progress Notes (Signed)
Cardiology Office Note:    Date:  04/30/2023   ID:  Samuel Wallace, DOB October 07, 1955, MRN [id number]  PCP:  Elizabeth Palau, FNP  Cardiologist:  Armanda Magic, MD    Referring MD: Elizabeth Palau, FNP   Chief Complaint  Patient presents with   Coronary Artery Disease   Hypertension   Hyperlipidemia    History of Present Illness:    Samuel Wallace is a 67 y.o. male with a hx of ASCAD s/p NSTEMI with DES to the ramus and OM in 2014, HTN and dyslipidemia.  He is here today for followup and is doing well.  He denies any chest pain or pressure, SOB, DOE, PND, orthopnea, LE edema, dizziness, palpitations or syncope. He is compliant with his meds and is tolerating meds with no SE.     Past Medical History:  Diagnosis Date   Aortic stenosis    moderate by echo 04/2023 with mean AVG and DVI 0.37   CAD (coronary artery disease) 09/30/2012   with DES to Ramus and OM with + NSTEMI   Diabetes mellitus without complication (HCC)    Hyperlipidemia    Hypertension     Past Surgical History:  Procedure Laterality Date   EYE SURGERY     ,detached retina repair.[facility]   LEFT AND RIGHT HEART CATHETERIZATION WITH CORONARY ANGIOGRAM  09/29/2012   Procedure: LEFT AND RIGHT HEART CATHETERIZATION WITH CORONARY ANGIOGRAM;  Surgeon: Lesleigh Noe, MD;  Location: [facility] CATH LAB;  Service: Cardiovascular;;    Current Medications: Current Meds  Medication Sig   aspirin 81 MG chewable tablet Chew 1 tablet (81 mg total) by mouth daily.   Empagliflozin-metFORMIN HCl (SYNJARDY) 12.10-998 MG TABS Take 1 tablet by mouth 2 (two) times daily with a meal.   glipiZIDE (GLUCOTROL XL) 2.5 MG 24 hr tablet Take 2.5 mg by mouth daily.   latanoprost (XALATAN) 0.005 % ophthalmic solution Place 1 drop into the right eye at bedtime.    lisinopril (ZESTRIL) 20 MG tablet TAKE 1 TABLET BY MOUTH ONCE  DAILY   meloxicam (MOBIC) 15 MG tablet Take 15 mg by mouth daily.   metoprolol tartrate (LOPRESSOR) 25 MG tablet  TAKE ONE-HALF TABLET BY MOUTH  TWICE DAILY   mupirocin ointment (BACTROBAN) 2 % Apply topically as needed.   nitroGLYCERIN (NITROSTAT) 0.4 MG SL tablet Place 0.4 mg under the tongue every 5 (five) minutes as needed.    omega-3 fish oil (MAXEPA) 1000 MG CAPS capsule Take 4 capsules (4,000 mg total) by mouth daily.   rosuvastatin (CRESTOR) 20 MG tablet TAKE 1 TABLET BY MOUTH ONCE  DAILY   [DISCONTINUED] Semaglutide 14 MG TABS Take 14 mg by mouth daily.     Allergies:   Patient has no known allergies.   Social History   Socioeconomic History   Marital status: Married    Spouse name: Not on file   Number of children: Not on file   Years of education: Not on file   Highest education level: Not on file  Occupational History   Not on file  Tobacco Use   Smoking status: Never   Smokeless tobacco: Never  Substance and Sexual Activity   Alcohol use: No   Drug use: No   Sexual activity: Not on file  Other Topics Concern   Not on file  Social History Narrative   Not on file   Social Determinants of Health   Financial Resource Strain: Low Risk  (04/26/2023)   Received from  [facility]   Overall Financial Resource Strain (CARDIA)    Difficulty of Paying Living Expenses: Not hard at all  Food Insecurity: No Food Insecurity (04/26/2023)   Received from [facility]   Hunger Vital Sign    Worried About Running Out of Food in the Last Year: Never true    Ran Out of Food in the Last Year: Never true  Transportation Needs: No Transportation Needs (04/26/2023)   Received from [facility] - Transportation    Lack of Transportation (Medical): No    Lack of Transportation (Non-Medical): No  Physical Activity: Insufficiently Active (05/24/2021)   Received from [facility], [facility]   Exercise Vital Sign    Days of Exercise per Week: 2 days    Minutes of Exercise per Session: 30 min  Stress: No Stress Concern Present (01/15/2022)   Received from [facility], [facility] - Occupational Stress Questionnaire    Feeling of Stress : Not at all  Social Connections: Unknown (11/02/2021)   Received from [facility], [facility]   Social Network    Social Network: Not on file     Family History: The patient's family history includes Diabetes in his mother; Hypertension in his mother.  ROS:   Please see the history of present illness.    ROS  All other systems reviewed and negative.   EKGs/Labs/Other Studies Reviewed:    EKG Interpretation Date/Time:  Tuesday April 30 2023 08:10:04 EDT Ventricular Rate:  66 PR Interval:  166 QRS Duration:  84 QT Interval:  386 QTC Calculation: 404 R Axis:   14  Text Interpretation: Normal sinus rhythm When compared with ECG of 30-Sep-2012 06:02, T wave inversion no longer evident in Lateral leads Confirmed by Armanda Magic ([id number]) on 04/30/2023 8:11:55 AM     Recent Labs: No results found for requested labs within last 365 days.   Recent Lipid Panel    Component Value Date/Time   CHOL 107 09/22/2020 0834   TRIG 125 09/22/2020 0834   HDL 40 09/22/2020 0834   CHOLHDL 2.7 09/22/2020 0834   CHOLHDL 2.9 07/20/2015 0757   VLDL 24 07/20/2015 0757   LDLCALC 45 09/22/2020 0834    Physical Exam:    VS:  BP (!) 148/82   Pulse 66   Ht 5\' 11"  (1.803 m)   Wt 165 lb 3.2 oz (74.9 kg)   SpO2 96%   BMI 23.04 kg/m     Wt Readings from Last 3 Encounters:  04/30/23 165 lb 3.2 oz (74.9 kg)  11/07/21 157 lb 3.2 oz (71.3 kg)  09/22/20 169 lb (76.7 kg)    GEN: Well nourished, well developed in no acute distress HEENT: Normal NECK: No JVD; No carotid bruits LYMPHATICS: No lymphadenopathy CARDIAC:RRR, no murmurs, rubs, gallops RESPIRATORY:  Clear to auscultation without rales, wheezing or rhonchi  ABDOMEN: Soft, non-tender, non-distended MUSCULOSKELETAL:  No edema; No deformity  SKIN: Warm and dry NEUROLOGIC:  Alert and oriented x 3 PSYCHIATRIC:  Normal affect   ASSESSMENT:    1. Coronary artery disease involving native coronary artery of native heart without angina pectoris   2. Primary hypertension   3. Pure hypercholesterolemia   4. Nonrheumatic aortic valve stenosis     PLAN:    In order of problems listed above:  1.  ASCAD -s/p NSTEMI with DES to the ramus and OM in 2014 -He has not had any anginal symptoms since he saw  me last -Continue prescription drug management with aspirin 81 mg daily, Lopressor 25 mg 1/2 tablet twice daily and Crestor 20 mg daily with as needed refills  2.  HTN -BP is borderline controlled on exam today but he just started Meloxicam for arthritis which can elevate it>>his BP was normal at his PCP office>>he does not notice any improvement with the NSAIDs>> recommended avoiding Meloxicam or Celebrex due to CAD and HTN -continue prescription drug management with Lisiniopril 20mg  daily and Lopressor 12.5mg  BID -I have personally reviewed and interpreted outside labs performed by patient's PCP which showed serum creatinine 0.96 and potassium 4.6 on 10/11/2022  3.  HLD -LDL goal is < 70 -I have personally reviewed and interpreted outside labs performed by patient's PCP which showed LDL 38 and HDL 40 with ALT 26 on 10/11/2022 -Continue prescription drug management Crestor 20 mg daily with as needed refills  4.  Aortic stenosis -2D echo 04/21/2023 showed EF 55 to 60% with moderate aortic valve stenosis with mean aortic valve gradient 23 mmHg, AVA by VTI 1.15 cm, DVI 0.37 and V-max 3.24 m/s -Repeat 2D echo in 1 year  Medication Adjustments/Labs and Tests Ordered: Current medicines are reviewed at length with the patient today.  Concerns regarding medicines are outlined above.  Orders Placed This Encounter  Procedures   EKG 12-Lead   No orders of the defined types were placed in this encounter.   Signed, Armanda Magic, MD  04/30/2023 8:43 AM    [facility]

## 2023-05-06 ENCOUNTER — Encounter: Payer: Self-pay | Admitting: Cardiology

## 2023-05-06 ENCOUNTER — Telehealth: Payer: Self-pay

## 2023-05-06 DIAGNOSIS — M79643 Pain in unspecified hand: Secondary | ICD-10-CM

## 2023-05-06 NOTE — Telephone Encounter (Unsigned)
Patient requests referral to rheumatologist as his hand pain did not improve with stopping meloxicam, referral placed. Patient remains on statin. Patient advised via Mychart message.

## 2023-05-22 ENCOUNTER — Other Ambulatory Visit: Payer: Self-pay | Admitting: Cardiology

## 2023-06-10 ENCOUNTER — Telehealth: Payer: Self-pay

## 2023-06-10 DIAGNOSIS — M79643 Pain in unspecified hand: Secondary | ICD-10-CM

## 2023-06-10 NOTE — Telephone Encounter (Signed)
Call to [facility], who state they have never received referral materials. Spoke to [place] who advised that referral materials be refaxed to [phone]. Order placed again, patient advised via MyChart.

## 2023-06-13 ENCOUNTER — Telehealth: Payer: Self-pay | Admitting: Cardiology

## 2023-06-13 NOTE — Telephone Encounter (Signed)
[facility] called in stating they received the referral however there are no demographics and they would need that to move forward. She also asked if there were any other notes that indicates his hand pain because the OV visit didn't discuss it. Please advise  Fax: [phone]

## 2023-06-14 NOTE — Telephone Encounter (Signed)
Caller Ladona Ridgel) stated patient was referred for hand pain but the office note they received was not for hand pain. Caller wants office note related to hand pain forwarded to them.  Fax: [phone].

## 2023-06-17 NOTE — Telephone Encounter (Signed)
Ladona Ridgel from [facility] is calling again to get update. Please advise.

## 2023-06-17 NOTE — Telephone Encounter (Signed)
Looks like referral placed and records sent will send ot the [facility] for follow up.

## 2023-06-21 NOTE — Telephone Encounter (Signed)
After discussion w/ Dr. Mayford Knife, referred patient back to PCP and assisted patient to make an appt.

## 2023-09-20 NOTE — Telephone Encounter (Signed)
 Received an update via onbase that patients NP appt with Rheumatology has been scheduled for 04/30 at 8 am.

## 2024-02-20 ENCOUNTER — Telehealth: Payer: Self-pay | Admitting: Cardiology

## 2024-02-20 DIAGNOSIS — I350 Nonrheumatic aortic (valve) stenosis: Secondary | ICD-10-CM

## 2024-02-20 NOTE — Telephone Encounter (Signed)
 Patient identification verified by 2 forms.   Called and spoke to patient  Patient states:  -Pt states Dr. Shlomo stated echo can be every 12-18 months.  -Pt states out of convenience January would be better.   Interventions/Plan: -Will get message to Dr. Shlomo to verify this is okay.   Patient agrees with plan, no questions at this time

## 2024-02-20 NOTE — Telephone Encounter (Signed)
 Wife (Tammy) stated patient wants to postpone his Echocardiogram until January 2026.  Wife wants patient's order extended to that time.

## 2024-02-21 NOTE — Telephone Encounter (Signed)
 Updated order for echo.

## 2024-02-25 ENCOUNTER — Other Ambulatory Visit: Payer: Self-pay | Admitting: Cardiology

## 2024-04-07 ENCOUNTER — Ambulatory Visit (HOSPITAL_COMMUNITY): Payer: 59

## 2024-04-24 ENCOUNTER — Ambulatory Visit: Admitting: Cardiology

## 2024-07-03 ENCOUNTER — Encounter: Payer: Self-pay | Admitting: Urology

## 2024-07-06 DIAGNOSIS — C61 Malignant neoplasm of prostate: Secondary | ICD-10-CM

## 2024-07-08 ENCOUNTER — Ambulatory Visit (HOSPITAL_COMMUNITY)
Admission: RE | Admit: 2024-07-08 | Discharge: 2024-07-08 | Disposition: A | Discharge status: Home / Self Care | Source: Ambulatory Visit | Attending: Internal Medicine | Admitting: Internal Medicine

## 2024-07-08 DIAGNOSIS — I350 Nonrheumatic aortic (valve) stenosis: Secondary | ICD-10-CM | POA: Diagnosis present

## 2024-07-08 DIAGNOSIS — I359 Nonrheumatic aortic valve disorder, unspecified: Secondary | ICD-10-CM

## 2024-07-08 LAB — ECHOCARDIOGRAM COMPLETE
AR max vel: 0.88 cm2
AV Area VTI: 1.07 cm2
AV Area mean vel: 0.88 cm2
AV Mean grad: 29 mmHg
AV Peak grad: 47.9 mmHg
Ao pk vel: 3.46 m/s
Area-P 1/2: 4.33 cm2
S' Lateral: 2.2 cm

## 2024-07-10 ENCOUNTER — Ambulatory Visit: Payer: Self-pay | Admitting: Cardiology

## 2024-07-10 ENCOUNTER — Other Ambulatory Visit: Payer: Self-pay

## 2024-07-10 DIAGNOSIS — I350 Nonrheumatic aortic (valve) stenosis: Secondary | ICD-10-CM

## 2024-07-17 ENCOUNTER — Encounter (HOSPITAL_COMMUNITY)
Admission: RE | Admit: 2024-07-17 | Discharge: 2024-07-17 | Disposition: A | Discharge status: Home / Self Care | Source: Ambulatory Visit | Attending: Urology | Admitting: Urology

## 2024-07-17 DIAGNOSIS — C61 Malignant neoplasm of prostate: Secondary | ICD-10-CM | POA: Insufficient documentation

## 2024-07-17 MED ORDER — FLOTUFOLASTAT F 18 GALLIUM 296-5846 MBQ/ML IV SOLN
8.2650 | Freq: Once | INTRAVENOUS | Status: AC
  Administered 2024-07-17: 8.265 via INTRAVENOUS

## 2024-07-24 ENCOUNTER — Encounter: Payer: Self-pay | Admitting: Cardiology

## 2024-07-27 ENCOUNTER — Inpatient Hospital Stay

## 2024-07-27 DIAGNOSIS — Z9189 Other specified personal risk factors, not elsewhere classified: Secondary | ICD-10-CM | POA: Insufficient documentation

## 2024-07-27 DIAGNOSIS — C61 Malignant neoplasm of prostate: Secondary | ICD-10-CM | POA: Insufficient documentation

## 2024-07-27 MED ORDER — ROSUVASTATIN CALCIUM 5 MG PO TABS: 5.0000 mg | ORAL_TABLET | Freq: Every day | ORAL | 3 refills | Status: AC

## 2024-07-27 NOTE — Assessment & Plan Note (Addendum)
 Supportive baseline bone mineral density study and then every 2 years calcium  (1000-1200 mg daily from food and supplements) and vitamin D3 (1000 IU daily) Healthy lifestyle to prevent diabetes and CV disease Aggressive cardiovascular risk management Weight-bearing exercises (30 minutes per day) Limit alcohol  consumption and avoid smoking

## 2024-07-27 NOTE — Progress Notes (Unsigned)
 [facility] CONSULT NOTE  Patient Care Team: Patient, No Pcp Per as PCP - General (General Practice) Samuel Wilbert SAUNDERS, MD as PCP - Cardiology (Cardiology) Samuel Pont, RN as Oncology Nurse Navigator  ASSESSMENT & PLAN:  [name] is a 69 y.o.male with history of CAD, MI, DM2, HTN, HLD being seen at [facility] for prostate cancer.  Primary Urologist: Dr. Renelda. 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 GS 4+4 GG4 Germline testing: to be drawn Somatic testing: to be drawn. Treatment: ADT/ARPI adding docetaxel  The patient was counseled on the natural history of prostate cancer and the standard treatment options that are available for prostate cancer.  Discussed natural course of metastatic prostate cancer.  Disease progression is expecting almost people after a period of time.  It is too early to assess prognosis at this time.  Continue monitoring PSA while and after chemotherapy, and during course of treatment is indicated.  Discussed ARASENS trial. An international, phase 3 trial, we randomly assigned patients with metastatic, hormone-sensitive prostate cancer in a 1:1 ratio to receive darolutamide 600-mg twice daily) or matching placebo, both in combination with androgen-deprivation therapy and docetaxel. Bone metastases were well represented.  The overall survival at 4 years was 62.7% in the darolutamide group and 50.4% in the placebo group. M1 disease was well represented. We do not have data without docetaxel in this study. However, from [facility] trial patients with high-volumedisease (n = 513), the median OS was 51.2 months with docetaxel plus ADT therapy versus 34.4 months with ADT alone (HR, 0.63).   We discussed docetaxel chemotherapy.  Previous study he using docetaxel like from [phone] showed benefit of docetaxel in addition to ADT especially in high-volume disease. Potential side effect  including fatigue, neutropenia, febrile neutropenia, thrombocytopenia, anemia, alopecia, skin rash, loss of nail and potentially infection, hypersensitivity, fluid retention with edema, diarrhea, nausea, vomiting, mouth sores, liver enzyme elevation, neuropathy, muscle pain, joint pain, visual change and rarely severe allergic reaction, skin rash, severe infection resulting in sepsis, and potential death.  Discussed the importance of recognizing fever, signs of infection while on treatment. Proceed to ED for emergency evaluation in the setting of fever, infection. Severe infection resulting in sepsis can be life threatening and result in death if no treated early. 4% of deaths were reported in both group.  ADT and ARPI can cause fatigue, and rarely rash, heart failure, ischemic heart disease in <5%.  In patients undergoing ADT with prostate cancer, a question of manage cardiovascular risk factors to decrease cardiac events recommended including controlling hypertension, hyperlipidemia, prevent diabetes and regular exercises are recommended.  After discussion, patient would like to proceed with chemotherapy. Assessment & Plan Prostatic adenocarcinoma (HCC) Recommend docetaxel with ADT/ARPI Germline and somatic testing. Teaching for docetaxel.with cold therapy to decrease risk of neuropathy At risk for side effect of medication Supportive baseline bone mineral density study and then every 2 years calcium  (1000-1200 mg daily from food and supplements) and vitamin D3 (1000 IU daily) Healthy lifestyle to prevent diabetes and CV disease Aggressive cardiovascular risk management Weight-bearing exercises (30 minutes per day) Limit alcohol consumption and avoid smoking Cancer related pain On Norco. Will increase fluid, fiber to prevent constipation  Orders Placed This Encounter  Procedures   Consent Attestation for Oncology Treatment    The patient is informed of risks, benefits, side-effects of the  prescribed oncology treatment. Potential short term and long term side effects and response rates  discussed. After a long discussion, the patient made informed decision to proceed.:   Yes   CBC with Differential (Cancer Center Only)    Standing Status:   Future    Expiration Date:   07/28/2025   CMP (Cancer Center only)    Standing Status:   Future    Expiration Date:   07/28/2025   Testosterone    Standing Status:   Future    Expiration Date:   07/28/2025   PSA    Standing Status:   Future    Expiration Date:   07/28/2025   PSA    Standing Status:   Future    Expected Date:   08/04/2024    Expiration Date:   08/04/2025   Testosterone    Standing Status:   Future    Expected Date:   08/04/2024    Expiration Date:   08/04/2025   CBC with Differential (Cancer Center Only)    Standing Status:   Future    Expected Date:   08/04/2024    Expiration Date:   08/04/2025   CMP (Cancer Center only)    Standing Status:   Future    Expected Date:   08/04/2024    Expiration Date:   08/04/2025   PSA    Standing Status:   Future    Expected Date:   08/25/2024    Expiration Date:   08/25/2025   Testosterone    Standing Status:   Future    Expected Date:   08/25/2024    Expiration Date:   08/25/2025   CBC with Differential (Cancer Center Only)    Standing Status:   Future    Expected Date:   08/25/2024    Expiration Date:   08/25/2025   CMP (Cancer Center only)    Standing Status:   Future    Expected Date:   08/25/2024    Expiration Date:   08/25/2025   PSA    Standing Status:   Future    Expected Date:   09/15/2024    Expiration Date:   09/15/2025   Testosterone    Standing Status:   Future    Expected Date:   09/15/2024    Expiration Date:   09/15/2025   CBC with Differential (Cancer Center Only)    Standing Status:   Future    Expected Date:   09/15/2024    Expiration Date:   09/15/2025   CMP (Cancer Center only)    Standing Status:   Future    Expected Date:   09/15/2024    Expiration Date:   09/15/2025    PSA    Standing Status:   Future    Expected Date:   10/06/2024    Expiration Date:   10/06/2025   Testosterone    Standing Status:   Future    Expected Date:   10/06/2024    Expiration Date:   10/06/2025   CBC with Differential (Cancer Center Only)    Standing Status:   Future    Expected Date:   10/06/2024    Expiration Date:   10/06/2025   CMP (Cancer Center only)    Standing Status:   Future    Expected Date:   10/06/2024    Expiration Date:   10/06/2025   PSA    Standing Status:   Future    Expected Date:   10/27/2024    Expiration Date:   10/27/2025   Testosterone    Standing Status:  Future    Expected Date:   10/27/2024    Expiration Date:   10/27/2025   CBC with Differential (Cancer Center Only)    Standing Status:   Future    Expected Date:   10/27/2024    Expiration Date:   10/27/2025   CMP (Cancer Center only)    Standing Status:   Future    Expected Date:   10/27/2024    Expiration Date:   10/27/2025   PSA    Standing Status:   Future    Expected Date:   11/17/2024    Expiration Date:   11/17/2025   Testosterone    Standing Status:   Future    Expected Date:   11/17/2024    Expiration Date:   11/17/2025   CBC with Differential (Cancer Center Only)    Standing Status:   Future    Expected Date:   11/17/2024    Expiration Date:   11/17/2025   CMP (Cancer Center only)    Standing Status:   Future    Expected Date:   11/17/2024    Expiration Date:   11/17/2025   PHYSICIAN COMMUNICATION ORDER    Pathway POS96 x 6 cycles with ADT.      All questions were answered. The patient knows to call the clinic with any problems, questions or concerns. No barriers to learning was detected.  Pauletta JAYSON Chihuahua, MD 1/27/202611:57 AM  CHIEF COMPLAINTS/PURPOSE OF CONSULTATION:  Prostate cancer  HISTORY OF PRESENTING ILLNESS:  Samuel Wallace 69 y.o. male is here because of prostate cancer. I have reviewed his chart and materials related to his cancer extensively and collaborated history with  the patient. Summary of oncologic history is as follows:  Patient was referred by Dr. Renelda. He presented with PSA of 642. On 07/08/24 underwent biopsy and showed 4+4=8 GG4 prostate cancer in 6/6 cores. PET showed diffuse high volume bone metastases, lymph node metastases and reported too many to count. He was referred here for considering adding chemotherapy to ADT and ARPI.  Oncology History  Prostatic adenocarcinoma (HCC)  07/27/2024 Initial Diagnosis   Prostatic adenocarcinoma (HCC)   07/27/2024 Cancer Staging   Staging form: Prostate, AJCC 8th Edition - Clinical: Stage IVB (cN1, pM1b, PSA: 642, Grade Group: 4) - Signed by Chihuahua Pauletta JAYSON, MD on 07/27/2024 Prostate specific antigen (PSA) range: 20 or greater Gleason score: 8 Histologic grading system: 5 grade system   08/04/2024 -  Chemotherapy   Patient is on Treatment Plan : PROSTATE Docetaxel (75) q21d       MEDICAL HISTORY:  Past Medical History:  Diagnosis Date   Aortic stenosis    moderate by echo 04/2023 with mean AVG and DVI 0.37   CAD (coronary artery disease) 09/30/2012   with DES to Ramus and OM with + NSTEMI   Diabetes mellitus without complication (HCC)    Hyperlipidemia    Hypertension     SURGICAL HISTORY: Past Surgical History:  Procedure Laterality Date   EYE SURGERY     ,detached retina repair.SABRA   LEFT AND RIGHT HEART CATHETERIZATION WITH CORONARY ANGIOGRAM  09/29/2012   Procedure: LEFT AND RIGHT HEART CATHETERIZATION WITH CORONARY ANGIOGRAM;  Surgeon: Victory LELON Claudene DOUGLAS, MD;  Location: [facility] CATH LAB;  Service: Cardiovascular;;    SOCIAL HISTORY: Social History   Socioeconomic History   Marital status: Married    Spouse name: Not on file   Number of children: Not on file   Years of education:  Not on file   Highest education level: Not on file  Occupational History   Not on file  Tobacco Use   Smoking status: Never   Smokeless tobacco: Never  Substance and Sexual Activity   Alcohol use: No    Drug use: No   Sexual activity: Not on file  Other Topics Concern   Not on file  Social History Narrative   Not on file   Social Drivers of Health   Tobacco Use: Low Risk (06/16/2024)   Received from [facility]   Patient History    Passive Exposure: Never    Smokeless Tobacco Use: Never    Smoking Tobacco Use: Never  Financial Resource Strain: Low Risk (04/26/2023)   Received from [facility]   Overall Financial Resource Strain (CARDIA)    Difficulty of Paying Living Expenses: Not hard at all  Food Insecurity: No Food Insecurity (07/28/2024)   Epic    Worried About Radiation Protection Practitioner of Food in the Last Year: Never true    Ran Out of Food in the Last Year: Never true  Transportation Needs: No Transportation Needs (07/28/2024)   Epic    Lack of Transportation (Medical): No    Lack of Transportation (Non-Medical): No  Physical Activity: Not on file  Stress: No Stress Concern Present (01/15/2022)   Received from [facility] of Occupational Health - Occupational Stress Questionnaire    Feeling of Stress : Not at all  Social Connections: Not on file  Intimate Partner Violence: Not At Risk (07/28/2024)   Epic    Fear of Current or Ex-Partner: No    Emotionally Abused: No    Physically Abused: No    Sexually Abused: No  Depression (PHQ2-9): Low Risk (07/28/2024)   Depression (PHQ2-9)    PHQ-2 Score: 0  Alcohol Screen: Not on file  Housing: Low Risk (07/28/2024)   Epic    Unable to Pay for Housing in the Last Year: No    Number of Times Moved in the Last Year: 0    Homeless in the Last Year: No  Utilities: Not At Risk (07/28/2024)   Epic    Threatened with loss of utilities: No  Health Literacy: Not on file    FAMILY HISTORY: Family History  Problem Relation Age of Onset   Hypertension Mother    Diabetes Mother     ALLERGIES:  has no known allergies.  MEDICATIONS:  Current Outpatient Medications  Medication Sig Dispense Refill   aspirin  81 MG  chewable tablet Chew 1 tablet (81 mg total) by mouth daily.     Empagliflozin-metFORMIN  HCl (SYNJARDY) 12.10-998 MG TABS Take 1 tablet by mouth 2 (two) times daily with a meal.     glipiZIDE (GLUCOTROL XL) 2.5 MG 24 hr tablet 1 tab Orally twice a day; Duration: 90 days     latanoprost  (XALATAN ) 0.005 % ophthalmic solution Place 1 drop into the right eye at bedtime.      lisinopril  (ZESTRIL ) 20 MG tablet TAKE 1 TABLET BY MOUTH ONCE  DAILY 90 tablet 3   metoprolol  tartrate (LOPRESSOR ) 25 MG tablet TAKE ONE-HALF TABLET BY MOUTH  TWICE DAILY 90 tablet 3   NUBEQA 300 MG tablet Take 600 mg by mouth 2 (two) times daily.     oxyCODONE -acetaminophen  (PERCOCET/ROXICET) 5-325 MG tablet Take 2 tablets by mouth every 8 (eight) hours as needed.     rosuvastatin  (CRESTOR ) 5 MG tablet Take 1 tablet (5 mg total) by mouth daily. 90  tablet 3   RYBELSUS 14 MG TABS Take 1 tablet by mouth every morning.     tamsulosin (FLOMAX) 0.4 MG CAPS capsule Take by mouth.     dexamethasone  (DECADRON ) 4 MG tablet Take 2 tabs by mouth 2 times daily starting day before chemo. Then take 2 tabs daily for 2 days starting day after chemo. Take with food. 30 tablet 1   ondansetron  (ZOFRAN ) 8 MG tablet Take 1 tablet (8 mg total) by mouth every 8 (eight) hours as needed for nausea or vomiting. 30 tablet 1   ORGOVYX 120 MG tablet Take by mouth. (Patient not taking: Reported on 07/28/2024)     prochlorperazine  (COMPAZINE ) 10 MG tablet Take 1 tablet (10 mg total) by mouth every 6 (six) hours as needed for nausea or vomiting. 30 tablet 1   No current facility-administered medications for this visit.    REVIEW OF SYSTEMS:   All relevant systems were reviewed with the patient and are negative.  PHYSICAL EXAMINATION: ECOG PERFORMANCE STATUS: 0  Vitals:   07/28/24 1051  BP: 127/74  Pulse: 80  Resp: 17  Temp: 97.8 F (36.6 C)  SpO2: 97%   Filed Weights   07/28/24 1051  Weight: 156 lb 14.4 oz (71.2 kg)    GENERAL: alert, no  distress and comfortable SKIN: skin color is normal, no jaundice EYES: sclera clear LUNGS: Effort normal, no respiratory distress.  ABDOMEN: soft, non-tender and nondistended Musculoskeletal: no point tenderness   LABORATORY DATA:  I have reviewed the results of PSA.  RADIOGRAPHIC STUDIES: I have personally reviewed the radiological images as listed and agreed with the findings in the report.

## 2024-07-27 NOTE — Assessment & Plan Note (Signed)
 Recommend docetaxel with ADT/ARPI Germline and somatic testing. Teaching for docetaxel.with cold therapy to decrease risk of neuropathy

## 2024-07-27 NOTE — Assessment & Plan Note (Signed)
 Recommend docetaxel with ADT/ARPI Germline and somatic testing.

## 2024-07-28 ENCOUNTER — Inpatient Hospital Stay

## 2024-07-28 ENCOUNTER — Telehealth: Payer: Self-pay

## 2024-07-28 ENCOUNTER — Other Ambulatory Visit: Payer: Self-pay

## 2024-07-28 VITALS — BP 127/74 | HR 80 | Temp 97.8°F | Resp 17 | Wt 156.9 lb

## 2024-07-28 DIAGNOSIS — Z9189 Other specified personal risk factors, not elsewhere classified: Secondary | ICD-10-CM

## 2024-07-28 DIAGNOSIS — C779 Secondary and unspecified malignant neoplasm of lymph node, unspecified: Secondary | ICD-10-CM

## 2024-07-28 DIAGNOSIS — C7951 Secondary malignant neoplasm of bone: Secondary | ICD-10-CM

## 2024-07-28 DIAGNOSIS — C61 Malignant neoplasm of prostate: Secondary | ICD-10-CM | POA: Diagnosis not present

## 2024-07-28 DIAGNOSIS — G893 Neoplasm related pain (acute) (chronic): Secondary | ICD-10-CM | POA: Insufficient documentation

## 2024-07-28 LAB — CBC WITH DIFFERENTIAL (CANCER CENTER ONLY)
Abs Immature Granulocytes: 0.05 10*3/uL (ref 0.00–0.07)
Basophils Absolute: 0 10*3/uL (ref 0.0–0.1)
Basophils Relative: 1 %
Eosinophils Absolute: 0 10*3/uL (ref 0.0–0.5)
Eosinophils Relative: 1 %
HCT: 31.9 % — ABNORMAL LOW (ref 39.0–52.0)
Hemoglobin: 10.5 g/dL — ABNORMAL LOW (ref 13.0–17.0)
Immature Granulocytes: 1 %
Lymphocytes Relative: 17 %
Lymphs Abs: 1 10*3/uL (ref 0.7–4.0)
MCH: 31.3 pg (ref 26.0–34.0)
MCHC: 32.9 g/dL (ref 30.0–36.0)
MCV: 94.9 fL (ref 80.0–100.0)
Monocytes Absolute: 0.5 10*3/uL (ref 0.1–1.0)
Monocytes Relative: 9 %
Neutro Abs: 4 10*3/uL (ref 1.7–7.7)
Neutrophils Relative %: 71 %
Platelet Count: 208 10*3/uL (ref 150–400)
RBC: 3.36 MIL/uL — ABNORMAL LOW (ref 4.22–5.81)
RDW: 15.7 % — ABNORMAL HIGH (ref 11.5–15.5)
WBC Count: 5.6 10*3/uL (ref 4.0–10.5)
nRBC: 0 % (ref 0.0–0.2)

## 2024-07-28 LAB — CMP (CANCER CENTER ONLY)
ALT: 16 U/L (ref 0–44)
AST: 29 U/L (ref 15–41)
Albumin: 4.6 g/dL (ref 3.5–5.0)
Alkaline Phosphatase: 293 U/L — ABNORMAL HIGH (ref 38–126)
Anion gap: 13 (ref 5–15)
BUN: 13 mg/dL (ref 8–23)
CO2: 26 mmol/L (ref 22–32)
Calcium: 10.2 mg/dL (ref 8.9–10.3)
Chloride: 103 mmol/L (ref 98–111)
Creatinine: 0.95 mg/dL (ref 0.61–1.24)
GFR, Estimated: >60 mL/min
Glucose, Bld: 117 mg/dL — ABNORMAL HIGH (ref 70–99)
Potassium: 4.2 mmol/L (ref 3.5–5.1)
Sodium: 143 mmol/L (ref 135–145)
Total Bilirubin: 0.6 mg/dL (ref 0.0–1.2)
Total Protein: 7.8 g/dL (ref 6.5–8.1)

## 2024-07-28 LAB — MISCELLANEOUS TEST

## 2024-07-28 LAB — PSA: Prostatic Specific Antigen: 1216 ng/mL — ABNORMAL HIGH (ref 0.00–4.00)

## 2024-07-28 LAB — GENETIC SCREENING ORDER

## 2024-07-28 MED ORDER — ONDANSETRON HCL 8 MG PO TABS: 8.0000 mg | ORAL_TABLET | Freq: Three times a day (TID) | ORAL | 1 refills | Status: AC | PRN

## 2024-07-28 MED ORDER — OXYCODONE-ACETAMINOPHEN 5-325 MG PO TABS: 2.0000 | ORAL_TABLET | Freq: Three times a day (TID) | ORAL | 0 refills | Status: AC | PRN

## 2024-07-28 MED ORDER — DEXAMETHASONE 4 MG PO TABS: ORAL_TABLET | ORAL | 1 refills | Status: AC

## 2024-07-28 MED ORDER — PROCHLORPERAZINE MALEATE 10 MG PO TABS: 10.0000 mg | ORAL_TABLET | Freq: Four times a day (QID) | ORAL | 1 refills | Status: AC | PRN

## 2024-07-28 NOTE — Assessment & Plan Note (Addendum)
 On Norco. Will increase fluid, fiber to prevent constipation

## 2024-07-28 NOTE — Patient Instructions (Signed)
 Discussed ARASENS trial. An international, phase 3 trial, we randomly assigned patients with metastatic, hormone-sensitive prostate cancer in a 1:1 ratio to receive darolutamide 600-mg twice daily) or matching placebo, both in combination with androgen-deprivation therapy and docetaxel. Bone metastases were well represented.  The overall survival at 4 years was 62.7% in the darolutamide group and 50.4% in the placebo group. M1 disease was well represented. We do not have data without docetaxel in this study. However, from [facility] trial patients with high-volumedisease (n = 513), the median OS was 51.2 months with docetaxel plus ADT therapy versus 34.4 months with ADT alone (HR, 0.63).   We discussed docetaxel chemotherapy.  Previous study he using docetaxel like from [phone] showed benefit of docetaxel in addition to ADT especially in high-volume disease. Potential side effect including fatigue, neutropenia, febrile neutropenia, thrombocytopenia, anemia, alopecia, skin rash, loss of nail and potentially infection, hypersensitivity, fluid retention with edema, diarrhea, nausea, vomiting, mouth sores, liver enzyme elevation, neuropathy, muscle pain, joint pain, visual change and rarely severe allergic reaction, skin rash, severe infection resulting in sepsis, and potential death.  Discussed the importance of recognizing fever, signs of infection while on treatment. Proceed to ED for emergency evaluation in the setting of fever, infection. Severe infection resulting in sepsis can be life threatening and result in death if no treated early. 4% of deaths were reported in both group.  ADT and ARPI can cause fatigue, and rarely rash, heart failure, ischemic heart disease in <5%.  In patients undergoing ADT with prostate cancer, a question of manage cardiovascular risk factors to decrease cardiac events recommended including controlling hypertension, hyperlipidemia, prevent diabetes and regular exercises are  recommended.

## 2024-07-28 NOTE — Telephone Encounter (Signed)
 [employer] 2021-05 - Specimen Collection Study to Evaluate Biomarkers in Subjects with Cancer   Called patient to introduce study. He will be in clinic next week for patient education and is available to meet with me after that. Appt added to his schedule with me.  Andrea MORTON Jani Moronta, RN, BSN, [facility]  Her  [facility] Clinical Research Nurse [facility] Direct Dial [phone] 07/28/2024 3:46 PM

## 2024-07-28 NOTE — Addendum Note (Signed)
 Addended by: ENCARNACION NORRIS on: 07/28/2024 11:45 AM   Modules accepted: Orders

## 2024-07-28 NOTE — Progress Notes (Signed)
START ON PATHWAY REGIMEN - Prostate ° ° °  Abiraterone/prednisone: A cycle is every 28 days: °    Abiraterone acetate (conventional)  °    Prednisone  °  Docetaxel cycles 1 through 6: A cycle is every 21 days: °    Docetaxel  °    Pegfilgrastim-xxxx  ° °**Always confirm dose/schedule in your pharmacy ordering system** ° °Patient Characteristics: °Adenocarcinoma, Recurrent/New Systemic Disease (Including Biochemical Recurrence), Non-Castrate, M1 °Histology: Adenocarcinoma °Therapeutic Status: Recurrent/New Systemic Disease (Including Biochemical Recurrence) °Intent of Therapy: °Non-Curative / Palliative Intent, Discussed with Patient °

## 2024-07-28 NOTE — Progress Notes (Signed)
 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 GS 4+4 GG4   Recommendations:   Germline testing: Drawn today, 1/27 Somatic testing: Drawn today, 1/27 Treatment: ADT/ARPI (prescribed by Urology) adding docetaxel (D1, C1 08/06/24)

## 2024-07-29 ENCOUNTER — Other Ambulatory Visit: Payer: Self-pay

## 2024-07-29 LAB — TESTOSTERONE: Testosterone: 219 ng/dL — ABNORMAL LOW (ref 264–916)

## 2024-07-30 NOTE — Progress Notes (Signed)
 Pharmacist Chemotherapy Monitoring - Initial Assessment    Anticipated start date: 08/06/24   The following has been reviewed per standard work regarding the patient's treatment regimen: The patient's diagnosis, treatment plan and drug doses, and organ/hematologic function Lab orders and baseline tests specific to treatment regimen  The treatment plan start date, drug sequencing, and pre-medications Prior authorization status  Patient's documented medication list, including drug-drug interaction screen and prescriptions for anti-emetics and supportive care specific to the treatment regimen The drug concentrations, fluid compatibility, administration routes, and timing of the medications to be used The patient's access for treatment and lifetime cumulative dose history, if applicable  The patient's medication allergies and previous infusion related reactions, if applicable   Changes made to treatment plan:  treatment plan date  Follow up needed:  N/A  Samuel Wallace, RPH, 07/30/2024  4:01 PM

## 2024-07-31 ENCOUNTER — Other Ambulatory Visit: Payer: Self-pay

## 2024-08-03 ENCOUNTER — Ambulatory Visit: Admitting: Cardiology

## 2024-08-04 ENCOUNTER — Other Ambulatory Visit: Payer: Self-pay

## 2024-08-04 ENCOUNTER — Inpatient Hospital Stay

## 2024-08-04 DIAGNOSIS — C61 Malignant neoplasm of prostate: Secondary | ICD-10-CM

## 2024-08-04 DIAGNOSIS — D649 Anemia, unspecified: Secondary | ICD-10-CM | POA: Insufficient documentation

## 2024-08-04 NOTE — Assessment & Plan Note (Addendum)
 Start docetaxel  with ADT/ARPI Germline and somatic testing. Cold therapy

## 2024-08-04 NOTE — Assessment & Plan Note (Addendum)
 Ferritin, b12 and folate

## 2024-08-06 ENCOUNTER — Inpatient Hospital Stay

## 2024-08-06 ENCOUNTER — Telehealth: Payer: Self-pay | Admitting: *Deleted

## 2024-08-06 ENCOUNTER — Other Ambulatory Visit: Payer: Self-pay | Admitting: *Deleted

## 2024-08-06 VITALS — BP 120/74 | HR 81 | Temp 97.9°F | Resp 16 | Ht 71.0 in | Wt 156.5 lb

## 2024-08-06 DIAGNOSIS — C61 Malignant neoplasm of prostate: Secondary | ICD-10-CM

## 2024-08-06 DIAGNOSIS — Z9189 Other specified personal risk factors, not elsewhere classified: Secondary | ICD-10-CM

## 2024-08-06 DIAGNOSIS — R066 Hiccough: Secondary | ICD-10-CM

## 2024-08-06 DIAGNOSIS — D649 Anemia, unspecified: Secondary | ICD-10-CM

## 2024-08-06 DIAGNOSIS — R300 Dysuria: Secondary | ICD-10-CM | POA: Insufficient documentation

## 2024-08-06 LAB — FERRITIN: Ferritin: 341 ng/mL — ABNORMAL HIGH (ref 24–336)

## 2024-08-06 LAB — VITAMIN B12: Vitamin B-12: 473 pg/mL (ref 180–914)

## 2024-08-06 LAB — FOLATE: Folate: 10.5 ng/mL

## 2024-08-06 MED ORDER — SODIUM CHLORIDE 0.9 % IV SOLN
75.0000 mg/m2 | Freq: Once | INTRAVENOUS | Status: AC
  Administered 2024-08-06: 140 mg via INTRAVENOUS
  Filled 2024-08-06: qty 14

## 2024-08-06 MED ORDER — DEXAMETHASONE SOD PHOSPHATE PF 10 MG/ML IJ SOLN
10.0000 mg | Freq: Once | INTRAMUSCULAR | Status: AC
  Administered 2024-08-06: 10 mg via INTRAVENOUS
  Filled 2024-08-06: qty 1

## 2024-08-06 MED ORDER — SODIUM CHLORIDE 0.9 % IV SOLN: INTRAVENOUS | Status: DC

## 2024-08-06 NOTE — Patient Instructions (Signed)
 [facility] - A DEPT OF [facility]  Discharge Instructions: Thank you for choosing [facility] to provide your oncology and hematology care.   If you have a lab appointment with the Cancer Center, please go directly to the Cancer Center and check in at the registration area.   Wear comfortable clothing and clothing appropriate for easy access to any Portacath or PICC line.   We strive to give you quality time with your provider. You may need to reschedule your appointment if you arrive late (15 or more minutes).  Arriving late affects you and other patients whose appointments are after yours.  Also, if you miss three or more appointments without notifying the office, you may be dismissed from the clinic at the provider's discretion.      For prescription refill requests, have your pharmacy contact our office and allow 72 hours for refills to be completed.    Today you received the following chemotherapy and/or immunotherapy agents: Docetaxel      To help prevent nausea and vomiting after your treatment, we encourage you to take your nausea medication as directed.  BELOW ARE SYMPTOMS THAT SHOULD BE REPORTED IMMEDIATELY: *FEVER GREATER THAN 100.4 F (38 C) OR HIGHER *CHILLS OR SWEATING *NAUSEA AND VOMITING THAT IS NOT CONTROLLED WITH YOUR NAUSEA MEDICATION *UNUSUAL SHORTNESS OF BREATH *UNUSUAL BRUISING OR BLEEDING *URINARY PROBLEMS (pain or burning when urinating, or frequent urination) *BOWEL PROBLEMS (unusual diarrhea, constipation, pain near the anus) TENDERNESS IN MOUTH AND THROAT WITH OR WITHOUT PRESENCE OF ULCERS (sore throat, sores in mouth, or a toothache) UNUSUAL RASH, SWELLING OR PAIN  UNUSUAL VAGINAL DISCHARGE OR ITCHING   Items with * indicate a potential emergency and should be followed up as soon as possible or go to the Emergency Department if any problems should occur.  Please show the CHEMOTHERAPY ALERT CARD or IMMUNOTHERAPY  ALERT CARD at check-in to the Emergency Department and triage nurse.  Should you have questions after your visit or need to cancel or reschedule your appointment, please contact [facility] - A DEPT OF [facility]  Dept: [phone]  and follow the prompts.  Office hours are 8:00 a.m. to 4:30 p.m. Monday - Friday. Please note that voicemails left after 4:00 p.m. may not be returned until the following business day.  We are closed weekends and major holidays. You have access to a nurse at all times for urgent questions. Please call the main number to the clinic Dept: [phone] and follow the prompts.   For any non-urgent questions, you may also contact your provider using MyChart. We now offer e-Visits for anyone 58 and older to request care online for non-urgent symptoms. For details visit [email or web address].   Also download the MyChart app! Go to the app store, search "MyChart", open the app, select , and log in with your MyChart username and password.`

## 2024-08-06 NOTE — Assessment & Plan Note (Addendum)
 Supportive baseline bone mineral density study calcium  (1000-1200 mg daily from food and supplements) and vitamin D3 (1000 IU daily) Healthy lifestyle to prevent diabetes and CV disease Aggressive cardiovascular risk management Weight-bearing exercises (30 minutes per day) Limit alcohol  consumption and avoid smoking

## 2024-08-06 NOTE — Progress Notes (Signed)
 Vaughn results final showing no clinically signifcant variants.  Copy will be scanned into Epic.

## 2024-08-06 NOTE — Assessment & Plan Note (Addendum)
 Improved after started Orgovyx and darolutamide

## 2024-08-06 NOTE — Telephone Encounter (Signed)
-----   Message from Pauletta Chihuahua, MD sent at 08/06/2024  2:44 PM EST ----- Jill Stopka would you fax bone density order to [facility] ? thank you.

## 2024-08-06 NOTE — Telephone Encounter (Signed)
 Bone density orders faxed to [facility]

## 2024-08-07 ENCOUNTER — Telehealth: Payer: Self-pay | Admitting: *Deleted

## 2024-08-07 MED ORDER — CHLORPROMAZINE HCL 10 MG PO TABS: 10.0000 mg | ORAL_TABLET | Freq: Three times a day (TID) | ORAL | 0 refills | Status: AC

## 2024-08-07 NOTE — Addendum Note (Signed)
 Addended by: TINA HUDSON on: 08/07/2024 10:10 AM   Modules accepted: Orders

## 2024-08-07 NOTE — Telephone Encounter (Signed)
 Called patient for chemo follow up. States he did very well. No nausea , vomiting or diarrhea. States he developed the hiccups as soon as the infusion stopped and they have continued to present time. They are not bothering me and it that is the worst side effect, I'm fine

## 2024-08-07 NOTE — Telephone Encounter (Signed)
 Notified patient that Dr Tina has sent in a prescription for thorazine  to use if needed for hiccups.

## 2024-08-07 NOTE — Telephone Encounter (Signed)
-----   Message from Nurse Damien ORN, RN sent at 08/06/2024  4:36 PM EST ----- Regarding: Dr tina new taxo First dose taxo. Tolerated well. Please f/u. Thanks

## 2024-08-24 ENCOUNTER — Ambulatory Visit: Admitting: Cardiology

## 2024-08-26 ENCOUNTER — Inpatient Hospital Stay

## 2024-08-26 ENCOUNTER — Inpatient Hospital Stay: Admitting: Physician Assistant
